# Patient Record
Sex: Female | Born: 1935 | Race: Black or African American | Hispanic: No | Smoking: Never smoker
Health system: Southern US, Community
[De-identification: ages and names within clinical notes are randomized; demographics above are authoritative.]

## PROBLEM LIST (undated history)

## (undated) DIAGNOSIS — F32A Depression, unspecified: Secondary | ICD-10-CM

## (undated) DIAGNOSIS — I219 Acute myocardial infarction, unspecified: Secondary | ICD-10-CM

## (undated) DIAGNOSIS — I10 Essential (primary) hypertension: Secondary | ICD-10-CM

## (undated) DIAGNOSIS — N183 Chronic kidney disease, stage 3 unspecified: Secondary | ICD-10-CM

## (undated) DIAGNOSIS — M1990 Unspecified osteoarthritis, unspecified site: Secondary | ICD-10-CM

## (undated) DIAGNOSIS — F329 Major depressive disorder, single episode, unspecified: Secondary | ICD-10-CM

## (undated) DIAGNOSIS — J189 Pneumonia, unspecified organism: Secondary | ICD-10-CM

## (undated) DIAGNOSIS — I639 Cerebral infarction, unspecified: Secondary | ICD-10-CM

## (undated) DIAGNOSIS — J45909 Unspecified asthma, uncomplicated: Secondary | ICD-10-CM

## (undated) DIAGNOSIS — E119 Type 2 diabetes mellitus without complications: Secondary | ICD-10-CM

## (undated) DIAGNOSIS — I509 Heart failure, unspecified: Secondary | ICD-10-CM

## (undated) HISTORY — DX: Depression, unspecified: F32.A

## (undated) HISTORY — PX: CATARACT EXTRACTION: SUR2

## (undated) HISTORY — DX: Major depressive disorder, single episode, unspecified: F32.9

## (undated) HISTORY — PX: ABDOMINAL HYSTERECTOMY: SHX81

## (undated) HISTORY — DX: Unspecified asthma, uncomplicated: J45.909

## (undated) HISTORY — DX: Essential (primary) hypertension: I10

## (undated) HISTORY — DX: Type 2 diabetes mellitus without complications: E11.9

---

## 2003-09-23 ENCOUNTER — Encounter: Admission: RE | Admit: 2003-09-23 | Discharge: 2003-09-23 | Payer: Self-pay | Admitting: *Deleted

## 2003-10-14 ENCOUNTER — Encounter: Admission: RE | Admit: 2003-10-14 | Discharge: 2003-10-14 | Payer: Self-pay | Admitting: Nephrology

## 2003-11-07 ENCOUNTER — Other Ambulatory Visit: Admission: RE | Admit: 2003-11-07 | Discharge: 2003-11-07 | Payer: Self-pay | Admitting: Nephrology

## 2004-06-23 ENCOUNTER — Ambulatory Visit: Payer: Self-pay | Admitting: Family Medicine

## 2004-07-20 ENCOUNTER — Ambulatory Visit: Payer: Self-pay | Admitting: Family Medicine

## 2004-07-29 ENCOUNTER — Ambulatory Visit: Payer: Self-pay | Admitting: Family Medicine

## 2004-08-24 ENCOUNTER — Ambulatory Visit: Payer: Self-pay | Admitting: Family Medicine

## 2004-09-02 ENCOUNTER — Ambulatory Visit: Payer: Self-pay | Admitting: Family Medicine

## 2004-10-11 ENCOUNTER — Ambulatory Visit: Payer: Self-pay | Admitting: Family Medicine

## 2004-11-16 ENCOUNTER — Ambulatory Visit: Payer: Self-pay | Admitting: Family Medicine

## 2004-12-27 ENCOUNTER — Ambulatory Visit: Payer: Self-pay | Admitting: Family Medicine

## 2005-02-14 ENCOUNTER — Ambulatory Visit: Payer: Self-pay | Admitting: Family Medicine

## 2005-03-03 ENCOUNTER — Ambulatory Visit: Payer: Self-pay | Admitting: Family Medicine

## 2005-05-26 ENCOUNTER — Ambulatory Visit: Payer: Self-pay | Admitting: Family Medicine

## 2005-07-22 ENCOUNTER — Ambulatory Visit: Payer: Self-pay | Admitting: Family Medicine

## 2005-08-26 ENCOUNTER — Ambulatory Visit: Payer: Self-pay | Admitting: Family Medicine

## 2005-10-10 ENCOUNTER — Ambulatory Visit: Payer: Self-pay | Admitting: Family Medicine

## 2005-10-17 ENCOUNTER — Ambulatory Visit: Payer: Self-pay | Admitting: Family Medicine

## 2009-01-02 ENCOUNTER — Encounter: Admission: RE | Admit: 2009-01-02 | Discharge: 2009-01-02 | Payer: Self-pay | Admitting: Nephrology

## 2011-04-08 ENCOUNTER — Emergency Department (HOSPITAL_COMMUNITY)
Admission: EM | Admit: 2011-04-08 | Discharge: 2011-04-08 | Disposition: A | Discharge status: Home / Self Care | Payer: Medicare Other | Attending: Emergency Medicine | Admitting: Emergency Medicine

## 2011-04-08 ENCOUNTER — Emergency Department (HOSPITAL_COMMUNITY): Payer: Medicare Other

## 2011-04-08 DIAGNOSIS — R29898 Other symptoms and signs involving the musculoskeletal system: Secondary | ICD-10-CM | POA: Insufficient documentation

## 2011-04-08 DIAGNOSIS — E119 Type 2 diabetes mellitus without complications: Secondary | ICD-10-CM | POA: Insufficient documentation

## 2011-04-08 DIAGNOSIS — R5381 Other malaise: Secondary | ICD-10-CM | POA: Insufficient documentation

## 2011-04-08 DIAGNOSIS — Z8673 Personal history of transient ischemic attack (TIA), and cerebral infarction without residual deficits: Secondary | ICD-10-CM | POA: Insufficient documentation

## 2011-04-08 DIAGNOSIS — R209 Unspecified disturbances of skin sensation: Secondary | ICD-10-CM | POA: Insufficient documentation

## 2014-02-04 ENCOUNTER — Ambulatory Visit: Payer: Self-pay | Admitting: Podiatrist

## 2014-07-25 ENCOUNTER — Ambulatory Visit (INDEPENDENT_AMBULATORY_CARE_PROVIDER_SITE_OTHER): Payer: Medicare HMO

## 2014-07-25 VITALS — BP 138/74 | HR 70 | Resp 12

## 2014-07-25 DIAGNOSIS — E1140 Type 2 diabetes mellitus with diabetic neuropathy, unspecified: Secondary | ICD-10-CM

## 2014-07-25 DIAGNOSIS — B351 Tinea unguium: Secondary | ICD-10-CM

## 2014-07-25 DIAGNOSIS — M79676 Pain in unspecified toe(s): Secondary | ICD-10-CM

## 2014-07-25 NOTE — Progress Notes (Signed)
   Subjective:    Patient ID: Meagan Roberts, female    DOB: 07/03/1927, 80 y.o.   MRN: [id number]  HPI  TRIM MY TOENAILS AND CHECK FEET.  Review of Systems  Musculoskeletal: Positive for myalgias, joint swelling and gait problem.  Psychiatric/Behavioral:       MEMORY LOSS  All other systems reviewed and are negative.      Objective:   Physical Exam 80 year old F connecting female presents this time having not been seen for over a year has diabetic foot and nail issues her diabetes is not been well-managed indicates her sugars been over 200 currently on prednisone she asked about seeing a diabetic specialist I suggested she consider [facility] in [place] she will look into that option. At this time patient is wearing a pair slip on shoes which are likely to tight and not appropriate for her feet however doctor has not improved diabetic shoes for her. Objective findings as follows vascular status appears to be intact although diminished DP and PT plus one over 4 bilateral there is mild +1 edema noted bilateral lower ankles epicritic and proprioceptive sensations intact although diminished on Semmes Weinstein to the forefoot digits and arch bilateral. There is normal plantar response and DTRs noted dermatologic the skin color pigment normal hair growth absent nails thick criptotic incurvated darkened yellow brittle consistent with onychomycosis no secondary infections no open wounds no ulcerations are noted patient has significant hyperesthesia.       Assessment & Plan:  Assessment this time is diabetes with history peripheral neuropathy some loss of control in the diabetes patient is currently on Sterapred prednisone which is likely raising her sugars. Patient does have digital contractures deformities thick brittle dystrophic frontal mycotic nails are debrided and the presence of diabetes and complications return for future diabetic foot palliative nail care is needed did  recommend using Fungi-Nail for the nails and again given option for [facility] for diabetes specialist. Follow-up in 3 months  Alvan Dameichard Rodolfo Gaster DPM

## 2014-07-25 NOTE — Patient Instructions (Signed)
Diabetes and Foot Care Diabetes may cause you to have problems because of poor blood supply (circulation) to your feet and legs. This may cause the skin on your feet to become thinner, break easier, and heal more slowly. Your skin may become dry, and the skin may peel and crack. You may also have nerve damage in your legs and feet causing decreased feeling in them. You may not notice minor injuries to your feet that could lead to infections or more serious problems. Taking care of your feet is one of the most important things you can do for yourself.  HOME CARE INSTRUCTIONS  Wear shoes at all times, even in the house. Do not go barefoot. Bare feet are easily injured.  Check your feet daily for blisters, cuts, and redness. If you cannot see the bottom of your feet, use a mirror or ask someone for help.  Wash your feet with warm water (do not use hot water) and mild soap. Then pat your feet and the areas between your toes until they are completely dry. Do not soak your feet as this can dry your skin.  Apply a moisturizing lotion or petroleum jelly (that does not contain alcohol and is unscented) to the skin on your feet and to dry, brittle toenails. Do not apply lotion between your toes.  Trim your toenails straight across. Do not dig under them or around the cuticle. File the edges of your nails with an emery board or nail file.  Do not cut corns or calluses or try to remove them with medicine.  Wear clean socks or stockings every day. Make sure they are not too tight. Do not wear knee-high stockings since they may decrease blood flow to your legs.  Wear shoes that fit properly and have enough cushioning. To break in new shoes, wear them for just a few hours a day. This prevents you from injuring your feet. Always look in your shoes before you put them on to be sure there are no objects inside.  Do not cross your legs. This may decrease the blood flow to your feet.  If you find a minor scrape,  cut, or break in the skin on your feet, keep it and the skin around it clean and dry. These areas may be cleansed with mild soap and water. Do not cleanse the area with peroxide, alcohol, or iodine.  When you remove an adhesive bandage, be sure not to damage the skin around it.  If you have a wound, look at it several times a day to make sure it is healing.  Do not use heating pads or hot water bottles. They may burn your skin. If you have lost feeling in your feet or legs, you may not know it is happening until it is too late.  Make sure your health care provider performs a complete foot exam at least annually or more often if you have foot problems. Report any cuts, sores, or bruises to your health care provider immediately. SEEK MEDICAL CARE IF:   You have an injury that is not healing.  You have cuts or breaks in the skin.  You have an ingrown nail.  You notice redness on your legs or feet.  You feel burning or tingling in your legs or feet.  You have pain or cramps in your legs and feet.  Your legs or feet are numb.  Your feet always feel cold. SEEK IMMEDIATE MEDICAL CARE IF:   There is increasing redness,   swelling, or pain in or around a wound.  There is a red line that goes up your leg.  Pus is coming from a wound.  You develop a fever or as directed by your health care provider.  You notice a bad smell coming from an ulcer or wound. Document Released: 07/22/2000 Document Revised: 03/27/2013 Document Reviewed: 01/01/2013 [facility] Patient Information 2015 [employer] [place]. This information is not intended to replace advice given to you by your health care provider. Make sure you discuss any questions you have with your health care provider.   Recommendations or suggestions for diabetic specialist can contact [facility] in [place], number can be found in the Yellow Pages of the [facility] directory, either Dr. Evlyn KannerSouth or any of the other diabetes  specialist there would be helpful.  For fungus nails, can obtain Fungi-Nail at any pharmacy without prescription. Apply Fungi-Nail to affected toenails daily for 12 months as instructed

## 2014-10-07 ENCOUNTER — Encounter (HOSPITAL_COMMUNITY): Payer: Self-pay | Admitting: Emergency Medicine

## 2014-10-07 ENCOUNTER — Emergency Department (HOSPITAL_COMMUNITY): Payer: Medicare HMO

## 2014-10-07 ENCOUNTER — Emergency Department (HOSPITAL_COMMUNITY)
Admission: EM | Admit: 2014-10-07 | Discharge: 2014-10-07 | Disposition: A | Discharge status: Home / Self Care | Payer: Medicare HMO | Attending: Emergency Medicine | Admitting: Emergency Medicine

## 2014-10-07 DIAGNOSIS — R200 Anesthesia of skin: Secondary | ICD-10-CM | POA: Diagnosis present

## 2014-10-07 DIAGNOSIS — I10 Essential (primary) hypertension: Secondary | ICD-10-CM | POA: Diagnosis not present

## 2014-10-07 DIAGNOSIS — H1131 Conjunctival hemorrhage, right eye: Secondary | ICD-10-CM | POA: Insufficient documentation

## 2014-10-07 DIAGNOSIS — Z8659 Personal history of other mental and behavioral disorders: Secondary | ICD-10-CM | POA: Insufficient documentation

## 2014-10-07 DIAGNOSIS — Z8673 Personal history of transient ischemic attack (TIA), and cerebral infarction without residual deficits: Secondary | ICD-10-CM | POA: Diagnosis not present

## 2014-10-07 DIAGNOSIS — E119 Type 2 diabetes mellitus without complications: Secondary | ICD-10-CM | POA: Insufficient documentation

## 2014-10-07 DIAGNOSIS — Z7952 Long term (current) use of systemic steroids: Secondary | ICD-10-CM | POA: Insufficient documentation

## 2014-10-07 DIAGNOSIS — J45909 Unspecified asthma, uncomplicated: Secondary | ICD-10-CM | POA: Insufficient documentation

## 2014-10-07 DIAGNOSIS — Z79899 Other long term (current) drug therapy: Secondary | ICD-10-CM | POA: Insufficient documentation

## 2014-10-07 DIAGNOSIS — R202 Paresthesia of skin: Secondary | ICD-10-CM

## 2014-10-07 DIAGNOSIS — I252 Old myocardial infarction: Secondary | ICD-10-CM | POA: Diagnosis not present

## 2014-10-07 HISTORY — DX: Acute myocardial infarction, unspecified: I21.9

## 2014-10-07 HISTORY — DX: Cerebral infarction, unspecified: I63.9

## 2014-10-07 LAB — COMPREHENSIVE METABOLIC PANEL
ALT: 26 U/L (ref 0–35)
AST: 48 U/L — ABNORMAL HIGH (ref 0–37)
Albumin: 3.7 g/dL (ref 3.5–5.2)
Alkaline Phosphatase: 82 U/L (ref 39–117)
Anion gap: 6 (ref 5–15)
BUN: 16 mg/dL (ref 6–23)
CALCIUM: 9.2 mg/dL (ref 8.4–10.5)
CO2: 30 mmol/L (ref 19–32)
CREATININE: 1.29 mg/dL — AB (ref 0.50–1.10)
Chloride: 103 mmol/L (ref 96–112)
GFR, EST AFRICAN AMERICAN: 43 mL/min — AB (ref >90)
GFR, EST NON AFRICAN AMERICAN: 37 mL/min — AB (ref >90)
Glucose, Bld: 103 mg/dL — ABNORMAL HIGH (ref 70–99)
Potassium: 3.1 mmol/L — ABNORMAL LOW (ref 3.5–5.1)
SODIUM: 139 mmol/L (ref 135–145)
Total Bilirubin: 0.8 mg/dL (ref 0.3–1.2)
Total Protein: 7.1 g/dL (ref 6.0–8.3)

## 2014-10-07 LAB — I-STAT CHEM 8, ED
BUN: 20 mg/dL (ref 6–23)
Calcium, Ion: 1.16 mmol/L (ref 1.13–1.30)
Chloride: 99 mmol/L (ref 96–112)
Creatinine, Ser: 1.2 mg/dL — ABNORMAL HIGH (ref 0.50–1.10)
Glucose, Bld: 102 mg/dL — ABNORMAL HIGH (ref 70–99)
HCT: 47 % — ABNORMAL HIGH (ref 36.0–46.0)
HEMOGLOBIN: 16 g/dL — AB (ref 12.0–15.0)
Potassium: 3.1 mmol/L — ABNORMAL LOW (ref 3.5–5.1)
Sodium: 143 mmol/L (ref 135–145)
TCO2: 27 mmol/L (ref 0–100)

## 2014-10-07 LAB — DIFFERENTIAL
Basophils Absolute: 0 10*3/uL (ref 0.0–0.1)
Basophils Relative: 0 % (ref 0–1)
EOS PCT: 6 % — AB (ref 0–5)
Eosinophils Absolute: 0.3 10*3/uL (ref 0.0–0.7)
LYMPHS PCT: 41 % (ref 12–46)
Lymphs Abs: 2.2 10*3/uL (ref 0.7–4.0)
MONO ABS: 0.5 10*3/uL (ref 0.1–1.0)
Monocytes Relative: 9 % (ref 3–12)
Neutro Abs: 2.3 10*3/uL (ref 1.7–7.7)
Neutrophils Relative %: 44 % (ref 43–77)

## 2014-10-07 LAB — I-STAT TROPONIN, ED: Troponin i, poc: 0.01 ng/mL (ref 0.00–0.08)

## 2014-10-07 LAB — CBG MONITORING, ED
Glucose-Capillary: 46 mg/dL — ABNORMAL LOW (ref 70–99)
Glucose-Capillary: 165 mg/dL — ABNORMAL HIGH (ref 70–99)
Glucose-Capillary: 182 mg/dL — ABNORMAL HIGH (ref 70–99)

## 2014-10-07 LAB — CBC
HCT: 44 % (ref 36.0–46.0)
Hemoglobin: 14.5 g/dL (ref 12.0–15.0)
MCH: 26.4 pg (ref 26.0–34.0)
MCHC: 33 g/dL (ref 30.0–36.0)
MCV: 80 fL (ref 78.0–100.0)
PLATELETS: 201 10*3/uL (ref 150–400)
RBC: 5.5 MIL/uL — ABNORMAL HIGH (ref 3.87–5.11)
RDW: 15.9 % — ABNORMAL HIGH (ref 11.5–15.5)
WBC: 5.2 10*3/uL (ref 4.0–10.5)

## 2014-10-07 LAB — PROTIME-INR
INR: 1.05 (ref 0.00–1.49)
Prothrombin Time: 13.8 seconds (ref 11.6–15.2)

## 2014-10-07 LAB — APTT: APTT: 30 s (ref 24–37)

## 2014-10-07 MED ORDER — POTASSIUM CHLORIDE CRYS ER 20 MEQ PO TBCR
40.0000 meq | EXTENDED_RELEASE_TABLET | Freq: Once | ORAL | Status: AC
  Administered 2014-10-07: 40 meq via ORAL
  Filled 2014-10-07: qty 2

## 2014-10-07 MED ORDER — POTASSIUM CHLORIDE CRYS ER 20 MEQ PO TBCR: 40.0000 meq | EXTENDED_RELEASE_TABLET | Freq: Two times a day (BID) | ORAL | Status: DC

## 2014-10-07 MED ORDER — DEXTROSE 50 % IV SOLN
50.0000 mL | Freq: Once | INTRAVENOUS | Status: AC
  Administered 2014-10-07: 50 mL via INTRAVENOUS
  Filled 2014-10-07: qty 50

## 2014-10-07 MED ORDER — ASPIRIN 81 MG PO CHEW
324.0000 mg | CHEWABLE_TABLET | Freq: Once | ORAL | Status: AC
  Administered 2014-10-07: 324 mg via ORAL
  Filled 2014-10-07: qty 4

## 2014-10-07 MED ORDER — LORAZEPAM 2 MG/ML IJ SOLN: 1.0000 mg | Freq: Once | INTRAMUSCULAR | Status: DC

## 2014-10-07 NOTE — ED Notes (Signed)
Neurology at bedside.

## 2014-10-07 NOTE — ED Notes (Addendum)
Patient in xray at this time, Family has left stated she will be back later in day, and has taken patient purse and cane with her, but left patient clothes in room in a belonging bag.

## 2014-10-07 NOTE — ED Notes (Signed)
Neuro at bedside.

## 2014-10-07 NOTE — Consult Note (Signed)
Referring Physician: Gwendolyn GrantWalden    Chief Complaint: right arm and leg pain and decreased sensation  HPI:                                                                                                                                         Meagan Roberts is an 81 y.o. female who lives with her daughter.  She states that yesterday "at some point in the afternoon she noted right arm and leg pain and decreased sensation".  She feels the symptoms have not worsened but also not improved.  Patient is not a good historian but states she has had a previous stroke which caused significant pain but cannot tell me which side. Currently she is feeling sick, feels "like she is going to faint"  But feels better after the door was opened. She denies any other symptoms such as HA, neck pain, blurred vision, weakness.    She does note she has a long history of back pain that involves her right LE.   Of note--on room air patients O2 saturation was 85 but with 2L it is 95%.  K+ 3.1 and currently being replenished.   Date last known well: Date: 10/06/2014 Time last known well: Unable to determine tPA Given: No: out of window Modified Rankin: Rankin Score=0    Past Medical History  Diagnosis Date  . Diabetes mellitus without complication   . Hypertension   . Asthma   . Depression   . Stroke   . MI (myocardial infarction)     History reviewed. No pertinent past surgical history.  Family History  Problem Relation Age of Onset  . Hypertension Mother   . Hypertension Father    Social History:  reports that she has never smoked. She does not have any smokeless tobacco history on file. She reports that she does not drink alcohol or use illicit drugs.  Allergies: No Known Allergies  Medications:                                                                                                                           No current facility-administered medications for this encounter.   Current Outpatient  Prescriptions  Medication Sig Dispense Refill  . atorvastatin (LIPITOR) 40 MG tablet   1  . AZOR 10-40 MG per tablet     . B-D ULTRAFINE III SHORT  PEN 31G X 8 MM MISC   0  . carvedilol (COREG) 25 MG tablet   0  . clopidogrel (PLAVIX) 75 MG tablet   0  . colchicine 0.6 MG tablet   0  . furosemide (LASIX) 40 MG tablet   0  . HUMALOG KWIKPEN 100 UNIT/ML KiwkPen   0  . LANTUS SOLOSTAR 100 UNIT/ML Solostar Pen   1  . ONE TOUCH ULTRA TEST test strip   0  . prednisoLONE acetate (PRED FORTE) 1 % ophthalmic suspension   0  . predniSONE (DELTASONE) 50 MG tablet Take 50 mg by mouth daily.  0  . traMADol (ULTRAM) 50 MG tablet   0     ROS:                                                                                                                                       History obtained from the patient  General ROS: negative for - chills, fatigue, fever, night sweats, weight gain or weight loss Psychological ROS: negative for - behavioral disorder, hallucinations, memory difficulties, mood swings or suicidal ideation Ophthalmic ROS: negative for - blurry vision, double vision, eye pain or loss of vision ENT ROS: negative for - epistaxis, nasal discharge, oral lesions, sore throat, tinnitus or vertigo Allergy and Immunology ROS: negative for - hives or itchy/watery eyes Hematological and Lymphatic ROS: negative for - bleeding problems, bruising or swollen lymph nodes Endocrine ROS: negative for - galactorrhea, hair pattern changes, polydipsia/polyuria or temperature intolerance Respiratory ROS: negative for - cough, hemoptysis, shortness of breath or wheezing Cardiovascular ROS: negative for - chest pain, dyspnea on exertion, edema or irregular heartbeat Gastrointestinal ROS: negative for - abdominal pain, diarrhea, hematemesis, nausea/vomiting or stool incontinence Genito-Urinary ROS: negative for - dysuria, hematuria, incontinence or urinary frequency/urgency Musculoskeletal ROS: negative for -  joint swelling or muscular weakness Neurological ROS: as noted in HPI Dermatological ROS: negative for rash and skin lesion changes  Neurologic Examination:                                                                                                      Blood pressure 141/92, pulse 64, temperature 98.8 F (37.1 C), temperature source Oral, resp. rate 17, height  (1.676 m), weight 104.327 kg (230 lb), SpO2 90 %.  HEENT-  Normocephalic, no lesions, without obvious abnormality.  Normal external eye and conjunctiva.  Normal TM's bilaterally.  Normal auditory canals and external ears. Normal external nose, mucus  membranes and septum.  Normal pharynx. Cardiovascular- S1, S2 normal, pulses palpable throughout   Lungs- chest clear, no wheezing, rales, normal symmetric air entry Abdomen- normal findings: bowel sounds normal Extremities- no edema Lymph-no adenopathy palpable Musculoskeletal-no joint tenderness, deformity or swelling Skin-warm and dry, no hyperpigmentation, vitiligo, or suspicious lesions  Neurological Examination Mental Status: Alert, oriented to hospital, month and year.  Speech fluent without evidence of aphasia.  Able to follow 3 step commands without difficulty. Cranial Nerves: II: Discs flat left eye; Visual fields grossly normal left eye, Right eye has significant sclerosis of cornea and pupil is asymetric and non reactive (post surgical), Left pupil equal, round, reactive III,IV, VI: ptosis not present, extra-ocular motions intact bilaterally V,VII: smile symmetric, facial light touch sensation decreased on the right VIII: hearing normal bilaterally IX,X: gag reflex present XI: bilateral shoulder shrug XII: midline tongue extension Motor: Right : Upper extremity   5/5    Left:     Upper extremity   5/5  Lower extremity   5/5     Lower extremity   5/5 Tone and bulk:normal tone throughout; no atrophy noted Sensory: Pinprick and light touch intact decreased on the  right arm and leg Deep Tendon Reflexes: 1+ and symmetric throughout bilateral UE, no KJ or AJ Plantars: Mute bilaterally Cerebellar: normal finger-to-nose and normal heel-to-shin test Gait: not tested secondary to safety       Lab Results: Basic Metabolic Panel:  Recent Labs Lab 10/07/14 1121 10/07/14 1133  NA 139 143  K 3.1* 3.1*  CL 103 99  CO2 30  --   GLUCOSE 103* 102*  BUN 16 20  CREATININE 1.29* 1.20*  CALCIUM 9.2  --     Liver Function Tests:  Recent Labs Lab 10/07/14 1121  AST 48*  ALT 26  ALKPHOS 82  BILITOT 0.8  PROT 7.1  ALBUMIN 3.7   No results for input(s): LIPASE, AMYLASE in the last 168 hours. No results for input(s): AMMONIA in the last 168 hours.  CBC:  Recent Labs Lab 10/07/14 1121 10/07/14 1133  WBC 5.2  --   NEUTROABS 2.3  --   HGB 14.5 16.0*  HCT 44.0 47.0*  MCV 80.0  --   PLT 201  --     Cardiac Enzymes: No results for input(s): CKTOTAL, CKMB, CKMBINDEX, TROPONINI in the last 168 hours.  Lipid Panel: No results for input(s): CHOL, TRIG, HDL, CHOLHDL, VLDL, LDLCALC in the last 168 hours.  CBG: No results for input(s): GLUCAP in the last 168 hours.  Microbiology: No results found for this or any previous visit.  Coagulation Studies:  Recent Labs  10/07/14 1121  LABPROT 13.8  INR 1.05    Imaging: Dg Chest 2 View  10/07/2014   CLINICAL DATA:  RIGHT-side numbness, hypertension, diabetes, asthma, prior MI  EXAM: CHEST  2 VIEW  COMPARISON:  11/04/2013  FINDINGS: Enlargement of cardiac silhouette with pulmonary vascular congestion.  Tortuous aorta.  Bronchitic changes with bibasilar atelectasis.  No definite infiltrate, pleural effusion or pneumothorax.  Prior pin fixation of the LEFT clavicle.  Mild degenerative disc disease changes thoracic spine.  IMPRESSION: Bronchitic changes with bibasilar atelectasis greater on LEFT.  Enlargement of cardiac silhouette with pulmonary vascular congestion.   Electronically Signed   By:  Ulyses Southward M.D.   On: 10/07/2014 12:05   Ct Head (brain) Wo Contrast  10/07/2014   CLINICAL DATA:  Right-sided numbness.  Stroke symptoms.  EXAM: CT HEAD WITHOUT CONTRAST  TECHNIQUE: Contiguous  axial images were obtained from the base of the skull through the vertex without intravenous contrast.  COMPARISON:  04/08/2011  FINDINGS: Sinuses/Soft tissues: Remote right medial orbital wall fracture. Clear paranasal sinuses and mastoid air cells.  Intracranial: moderate low density in the periventricular white matter likely related to small vessel disease. Ventriculomegaly, felt to be related to cerebral atrophy. No mass lesion, hemorrhage, hydrocephalus, acute infarct, intra-axial, or extra-axial fluid collection.  IMPRESSION: 1.  No acute intracranial abnormality. 2.  Cerebral atrophy and small vessel ischemic change.   Electronically Signed   By: Jeronimo Greaves M.D.   On: 10/07/2014 12:44       Assessment and plan discussed with with attending physician and they are in agreement.    Felicie Morn PA-C [facility] [phone]  10/07/2014, 1:53 PM  Stroke Risk Factors - diabetes mellitus and hypertension   Assessment: 81 y.o. female with new onset right face, arm and leg pain and decreased sensation.  Exam is non focal other than sensory findings. Her symptoms are similar to a previous stroke, so possibilities include eunmasking of previous symptoms due to physiological stressor vs recurrent stroke.   1) MRI brain, if positive would pursue further stroke workup.  2) If negative, no further workup at this time.   Ritta Slot, MD [facility] [phone]  If 7pm- 7am, please page neurology on call as listed in AMION.

## 2014-10-07 NOTE — ED Notes (Signed)
Onset last night developed numbness right upper and lower extremity at 1800. Took a baby aspirin and went to sleep. Woke up this morning and still had numbness spoke with Doctor instructed to go to ED for evaluation.

## 2014-10-07 NOTE — ED Provider Notes (Signed)
CSN: [id number]     Arrival date & time 10/07/14  1107 History   First MD Initiated Contact with Patient 10/07/14 1114     Chief Complaint  Patient presents with  . Stroke Symptoms     (Consider location/radiation/quality/duration/timing/severity/associated sxs/prior Treatment) HPI Comments: Had some R arm tingling last night, not similar to prior stroke. Took an aspirin with some relief, which alleviated numbness a little, however numbness persisted.  Patient is a 81 y.o. female presenting with neurologic complaint. The history is provided by the patient.  Neurologic Problem This is a new problem. The current episode started yesterday. The problem occurs constantly. The problem has not changed (waxing/waning) since onset.Pertinent negatives include no chest pain and no abdominal pain. Nothing aggravates the symptoms. Nothing relieves the symptoms. She has tried ASA for the symptoms. The treatment provided moderate relief.    Past Medical History  Diagnosis Date  . Diabetes mellitus without complication   . Hypertension   . Asthma   . Depression   . Stroke   . MI (myocardial infarction)    History reviewed. No pertinent past surgical history. No family history on file. History  Substance Use Topics  . Smoking status: Never Smoker   . Smokeless tobacco: Not on file  . Alcohol Use: No   OB History    No data available     Review of Systems  Constitutional: Negative for fever and chills.  Cardiovascular: Negative for chest pain and leg swelling.  Gastrointestinal: Negative for vomiting and abdominal pain.  All other systems reviewed and are negative.     Allergies  Review of patient's allergies indicates no known allergies.  Home Medications   Prior to Admission medications   Medication Sig Start Date End Date Taking? Authorizing Provider  atorvastatin (LIPITOR) 40 MG tablet  06/30/14   Historical Provider, MD  AZOR 10-40 MG per tablet  06/13/14   Historical  Provider, MD  B-D ULTRAFINE III SHORT PEN 31G X 8 MM MISC  06/13/14   Historical Provider, MD  carvedilol (COREG) 25 MG tablet  06/03/14   Historical Provider, MD  clopidogrel (PLAVIX) 75 MG tablet  06/13/14   Historical Provider, MD  colchicine 0.6 MG tablet  07/03/14   Historical Provider, MD  furosemide (LASIX) 40 MG tablet  06/03/14   Historical Provider, MD  HUMALOG KWIKPEN 100 UNIT/ML KiwkPen  06/09/14   Historical Provider, MD  LANTUS SOLOSTAR 100 UNIT/ML Solostar Pen  06/05/14   Historical Provider, MD  ONE TOUCH ULTRA TEST test strip  06/09/14   Historical Provider, MD  prednisoLONE acetate (PRED FORTE) 1 % ophthalmic suspension  07/14/14   Historical Provider, MD  predniSONE (DELTASONE) 50 MG tablet Take 50 mg by mouth daily. 07/15/14   Historical Provider, MD  traMADol (ULTRAM) 50 MG tablet  06/27/14   Historical Provider, MD   BP 135/87 mmHg  Pulse 76  Resp 76  Ht 5\' 6"  (1.676 m)  Wt 230 lb (104.327 kg)  BMI 37.14 kg/m2  SpO2 92% Physical Exam  Constitutional: She is oriented to person, place, and time. She appears well-developed and well-nourished. No distress.  HENT:  Head: Normocephalic and atraumatic.  Mouth/Throat: Oropharynx is clear and moist.  Eyes: EOM are normal. Pupils are equal, round, and reactive to light.  Large R lower eye subconjunctival hemorrhage  Neck: Normal range of motion. Neck supple.  Cardiovascular: Normal rate and regular rhythm.  Exam reveals no friction rub.   No murmur heard. Pulmonary/Chest: Effort  normal and breath sounds normal. No respiratory distress. She has no wheezes. She has no rales.  Abdominal: Soft. She exhibits no distension. There is no tenderness. There is no rebound.  Musculoskeletal: Normal range of motion. She exhibits no edema.  Neurological: She is alert and oriented to person, place, and time. No cranial nerve deficit. She exhibits normal muscle tone. Coordination normal.  Skin: No rash noted. She is not diaphoretic.  Nursing  note and vitals reviewed.   ED Course  Procedures (including critical care time) Labs Review Labs Reviewed  CBC - Abnormal; Notable for the following:    RBC 5.50 (*)    RDW 15.9 (*)    All other components within normal limits  DIFFERENTIAL - Abnormal; Notable for the following:    Eosinophils Relative 6 (*)    All other components within normal limits  I-STAT CHEM 8, ED - Abnormal; Notable for the following:    Potassium 3.1 (*)    Creatinine, Ser 1.20 (*)    Glucose, Bld 102 (*)    Hemoglobin 16.0 (*)    HCT 47.0 (*)    All other components within normal limits  PROTIME-INR  APTT  COMPREHENSIVE METABOLIC PANEL  I-STAT TROPOININ, ED    Imaging Review No results found.   EKG Interpretation   Date/Time:  Tuesday October 07 2014 11:14:10 EST Ventricular Rate:  74 PR Interval:  200 QRS Duration: 116 QT Interval:  424 QTC Calculation: 470 R Axis:   -51 Text Interpretation:  Normal sinus rhythm Left axis deviation Possible  Anterolateral infarct , age undetermined Abnormal ECG No prior for  comparison Confirmed by [facility]  MD, Marrion Finan ([id number]) on 10/07/2014 11:26:25 AM      MDM   Final diagnoses:  Numbness on right side    65F presents with R side numbness, began last night. Improved after aspirin, but never fully resolved. There again this morning, constant. Hx of prior CVAs, however no symptoms like this. No CP, SOB. AFVSS here. Mild R side altered light touch sensation of arm, leg, face. No cranial nerves abnormalities. Strength normal.  Concern for CVA, will CT and plan on Neuro consult, MR. MR negative. Oxygen saturation ok on room air, hovering around 90. Isn't complaining of SOB. Plan for discharge and f/u with PCP. She is comfortable with the plan to go home.  Elwin Mocha, MD 10/07/14 1745

## 2014-10-07 NOTE — Discharge Instructions (Signed)

## 2014-10-07 NOTE — ED Notes (Signed)
Pt is in stable condition upon d/c and is escorted from ED via wheelchair. 

## 2014-10-24 ENCOUNTER — Ambulatory Visit: Payer: Medicare HMO

## 2017-02-02 DIAGNOSIS — I2510 Atherosclerotic heart disease of native coronary artery without angina pectoris: Secondary | ICD-10-CM

## 2017-02-02 DIAGNOSIS — N179 Acute kidney failure, unspecified: Secondary | ICD-10-CM

## 2017-02-02 DIAGNOSIS — R509 Fever, unspecified: Secondary | ICD-10-CM

## 2017-02-02 DIAGNOSIS — F0390 Unspecified dementia without behavioral disturbance: Secondary | ICD-10-CM

## 2017-02-02 DIAGNOSIS — R748 Abnormal levels of other serum enzymes: Secondary | ICD-10-CM

## 2017-02-02 DIAGNOSIS — J9621 Acute and chronic respiratory failure with hypoxia: Secondary | ICD-10-CM

## 2017-02-05 DIAGNOSIS — J189 Pneumonia, unspecified organism: Secondary | ICD-10-CM

## 2017-02-05 HISTORY — DX: Pneumonia, unspecified organism: J18.9

## 2017-02-08 DIAGNOSIS — E160 Drug-induced hypoglycemia without coma: Secondary | ICD-10-CM

## 2017-02-08 DIAGNOSIS — E119 Type 2 diabetes mellitus without complications: Secondary | ICD-10-CM

## 2017-02-08 DIAGNOSIS — I214 Non-ST elevation (NSTEMI) myocardial infarction: Secondary | ICD-10-CM

## 2017-02-10 ENCOUNTER — Encounter (HOSPITAL_COMMUNITY): Payer: Self-pay | Admitting: General Practice

## 2017-02-10 ENCOUNTER — Inpatient Hospital Stay (HOSPITAL_COMMUNITY): Payer: Medicare HMO

## 2017-02-10 ENCOUNTER — Inpatient Hospital Stay (HOSPITAL_COMMUNITY)
Admission: AD | Admit: 2017-02-10 | Discharge: 2017-02-20 | DRG: 280 | Disposition: A | Discharge status: Skilled Nursing Facility | Payer: Medicare HMO | Source: Other Acute Inpatient Hospital | Attending: Cardiology | Admitting: Cardiology

## 2017-02-10 DIAGNOSIS — R748 Abnormal levels of other serum enzymes: Secondary | ICD-10-CM | POA: Diagnosis not present

## 2017-02-10 DIAGNOSIS — J9601 Acute respiratory failure with hypoxia: Secondary | ICD-10-CM

## 2017-02-10 DIAGNOSIS — J189 Pneumonia, unspecified organism: Secondary | ICD-10-CM | POA: Diagnosis present

## 2017-02-10 DIAGNOSIS — T424X5A Adverse effect of benzodiazepines, initial encounter: Secondary | ICD-10-CM | POA: Diagnosis not present

## 2017-02-10 DIAGNOSIS — R0902 Hypoxemia: Secondary | ICD-10-CM

## 2017-02-10 DIAGNOSIS — R739 Hyperglycemia, unspecified: Secondary | ICD-10-CM | POA: Diagnosis not present

## 2017-02-10 DIAGNOSIS — J9621 Acute and chronic respiratory failure with hypoxia: Secondary | ICD-10-CM | POA: Diagnosis present

## 2017-02-10 DIAGNOSIS — J45909 Unspecified asthma, uncomplicated: Secondary | ICD-10-CM | POA: Diagnosis not present

## 2017-02-10 DIAGNOSIS — J9690 Respiratory failure, unspecified, unspecified whether with hypoxia or hypercapnia: Secondary | ICD-10-CM

## 2017-02-10 DIAGNOSIS — J810 Acute pulmonary edema: Secondary | ICD-10-CM | POA: Diagnosis not present

## 2017-02-10 DIAGNOSIS — I5033 Acute on chronic diastolic (congestive) heart failure: Secondary | ICD-10-CM | POA: Diagnosis not present

## 2017-02-10 DIAGNOSIS — Y95 Nosocomial condition: Secondary | ICD-10-CM | POA: Diagnosis present

## 2017-02-10 DIAGNOSIS — E1165 Type 2 diabetes mellitus with hyperglycemia: Secondary | ICD-10-CM | POA: Diagnosis not present

## 2017-02-10 DIAGNOSIS — Z8701 Personal history of pneumonia (recurrent): Secondary | ICD-10-CM

## 2017-02-10 DIAGNOSIS — G92 Toxic encephalopathy: Secondary | ICD-10-CM | POA: Diagnosis present

## 2017-02-10 DIAGNOSIS — I2510 Atherosclerotic heart disease of native coronary artery without angina pectoris: Secondary | ICD-10-CM | POA: Diagnosis not present

## 2017-02-10 DIAGNOSIS — Z880 Allergy status to penicillin: Secondary | ICD-10-CM

## 2017-02-10 DIAGNOSIS — J9602 Acute respiratory failure with hypercapnia: Secondary | ICD-10-CM | POA: Diagnosis not present

## 2017-02-10 DIAGNOSIS — I5031 Acute diastolic (congestive) heart failure: Secondary | ICD-10-CM | POA: Diagnosis not present

## 2017-02-10 DIAGNOSIS — Z79899 Other long term (current) drug therapy: Secondary | ICD-10-CM

## 2017-02-10 DIAGNOSIS — I700 Atherosclerosis of aorta: Secondary | ICD-10-CM | POA: Diagnosis present

## 2017-02-10 DIAGNOSIS — E785 Hyperlipidemia, unspecified: Secondary | ICD-10-CM | POA: Diagnosis present

## 2017-02-10 DIAGNOSIS — L89152 Pressure ulcer of sacral region, stage 2: Secondary | ICD-10-CM | POA: Diagnosis not present

## 2017-02-10 DIAGNOSIS — R0989 Other specified symptoms and signs involving the circulatory and respiratory systems: Secondary | ICD-10-CM | POA: Diagnosis present

## 2017-02-10 DIAGNOSIS — E662 Morbid (severe) obesity with alveolar hypoventilation: Secondary | ICD-10-CM | POA: Diagnosis present

## 2017-02-10 DIAGNOSIS — I5041 Acute combined systolic (congestive) and diastolic (congestive) heart failure: Secondary | ICD-10-CM | POA: Diagnosis not present

## 2017-02-10 DIAGNOSIS — I5042 Chronic combined systolic (congestive) and diastolic (congestive) heart failure: Secondary | ICD-10-CM

## 2017-02-10 DIAGNOSIS — Z882 Allergy status to sulfonamides status: Secondary | ICD-10-CM

## 2017-02-10 DIAGNOSIS — E118 Type 2 diabetes mellitus with unspecified complications: Secondary | ICD-10-CM | POA: Diagnosis not present

## 2017-02-10 DIAGNOSIS — E1122 Type 2 diabetes mellitus with diabetic chronic kidney disease: Secondary | ICD-10-CM | POA: Diagnosis present

## 2017-02-10 DIAGNOSIS — L89302 Pressure ulcer of unspecified buttock, stage 2: Secondary | ICD-10-CM | POA: Diagnosis not present

## 2017-02-10 DIAGNOSIS — L89153 Pressure ulcer of sacral region, stage 3: Secondary | ICD-10-CM

## 2017-02-10 DIAGNOSIS — Z90710 Acquired absence of both cervix and uterus: Secondary | ICD-10-CM

## 2017-02-10 DIAGNOSIS — N183 Chronic kidney disease, stage 3 (moderate): Secondary | ICD-10-CM | POA: Diagnosis present

## 2017-02-10 DIAGNOSIS — I214 Non-ST elevation (NSTEMI) myocardial infarction: Secondary | ICD-10-CM | POA: Diagnosis not present

## 2017-02-10 DIAGNOSIS — N179 Acute kidney failure, unspecified: Secondary | ICD-10-CM | POA: Diagnosis present

## 2017-02-10 DIAGNOSIS — Z794 Long term (current) use of insulin: Secondary | ICD-10-CM

## 2017-02-10 DIAGNOSIS — Z6839 Body mass index (BMI) 39.0-39.9, adult: Secondary | ICD-10-CM | POA: Diagnosis not present

## 2017-02-10 DIAGNOSIS — I252 Old myocardial infarction: Secondary | ICD-10-CM | POA: Diagnosis not present

## 2017-02-10 DIAGNOSIS — J9611 Chronic respiratory failure with hypoxia: Secondary | ICD-10-CM

## 2017-02-10 DIAGNOSIS — I25110 Atherosclerotic heart disease of native coronary artery with unstable angina pectoris: Secondary | ICD-10-CM | POA: Diagnosis present

## 2017-02-10 DIAGNOSIS — L8990 Pressure ulcer of unspecified site, unspecified stage: Secondary | ICD-10-CM | POA: Insufficient documentation

## 2017-02-10 DIAGNOSIS — J9600 Acute respiratory failure, unspecified whether with hypoxia or hypercapnia: Secondary | ICD-10-CM | POA: Diagnosis not present

## 2017-02-10 DIAGNOSIS — R0603 Acute respiratory distress: Secondary | ICD-10-CM | POA: Diagnosis not present

## 2017-02-10 DIAGNOSIS — I5043 Acute on chronic combined systolic (congestive) and diastolic (congestive) heart failure: Secondary | ICD-10-CM | POA: Diagnosis present

## 2017-02-10 DIAGNOSIS — I130 Hypertensive heart and chronic kidney disease with heart failure and stage 1 through stage 4 chronic kidney disease, or unspecified chronic kidney disease: Secondary | ICD-10-CM | POA: Diagnosis present

## 2017-02-10 DIAGNOSIS — R0600 Dyspnea, unspecified: Secondary | ICD-10-CM

## 2017-02-10 DIAGNOSIS — Z888 Allergy status to other drugs, medicaments and biological substances status: Secondary | ICD-10-CM

## 2017-02-10 DIAGNOSIS — Z8673 Personal history of transient ischemic attack (TIA), and cerebral infarction without residual deficits: Secondary | ICD-10-CM | POA: Diagnosis not present

## 2017-02-10 DIAGNOSIS — Z9981 Dependence on supplemental oxygen: Secondary | ICD-10-CM

## 2017-02-10 DIAGNOSIS — Z8249 Family history of ischemic heart disease and other diseases of the circulatory system: Secondary | ICD-10-CM | POA: Diagnosis not present

## 2017-02-10 DIAGNOSIS — IMO0001 Reserved for inherently not codable concepts without codable children: Secondary | ICD-10-CM

## 2017-02-10 DIAGNOSIS — I447 Left bundle-branch block, unspecified: Secondary | ICD-10-CM | POA: Diagnosis not present

## 2017-02-10 DIAGNOSIS — I10 Essential (primary) hypertension: Secondary | ICD-10-CM | POA: Diagnosis not present

## 2017-02-10 HISTORY — DX: Chronic kidney disease, stage 3 (moderate): N18.3

## 2017-02-10 HISTORY — DX: Heart failure, unspecified: I50.9

## 2017-02-10 HISTORY — DX: Unspecified osteoarthritis, unspecified site: M19.90

## 2017-02-10 HISTORY — DX: Pneumonia, unspecified organism: J18.9

## 2017-02-10 HISTORY — DX: Chronic kidney disease, stage 3 unspecified: N18.30

## 2017-02-10 LAB — BLOOD GAS, ARTERIAL
ACID-BASE EXCESS: 4.3 mmol/L — AB (ref 0.0–2.0)
Bicarbonate: 29.6 mmol/L — ABNORMAL HIGH (ref 20.0–28.0)
Delivery systems: POSITIVE
Drawn by: 331761
EXPIRATORY PAP: 7
FIO2: 60
INSPIRATORY PAP: 12
LHR: 12 {breaths}/min
O2 SAT: 86.8 %
PATIENT TEMPERATURE: 98.6
PCO2 ART: 55.9 mmHg — AB (ref 32.0–48.0)
PH ART: 7.344 — AB (ref 7.350–7.450)
PO2 ART: 56.6 mmHg — AB (ref 83.0–108.0)

## 2017-02-10 LAB — HEPARIN LEVEL (UNFRACTIONATED): Heparin Unfractionated: 0.47 IU/mL (ref 0.30–0.70)

## 2017-02-10 LAB — GLUCOSE, CAPILLARY: GLUCOSE-CAPILLARY: 265 mg/dL — AB (ref 65–99)

## 2017-02-10 LAB — BASIC METABOLIC PANEL
Anion gap: 7 (ref 5–15)
BUN: 21 mg/dL — ABNORMAL HIGH (ref 6–20)
CHLORIDE: 102 mmol/L (ref 101–111)
CO2: 28 mmol/L (ref 22–32)
CREATININE: 1.47 mg/dL — AB (ref 0.44–1.00)
Calcium: 9 mg/dL (ref 8.9–10.3)
GFR calc Af Amer: 36 mL/min — ABNORMAL LOW (ref >60)
GFR calc non Af Amer: 31 mL/min — ABNORMAL LOW (ref >60)
Glucose, Bld: 291 mg/dL — ABNORMAL HIGH (ref 65–99)
POTASSIUM: 4.3 mmol/L (ref 3.5–5.1)
Sodium: 137 mmol/L (ref 135–145)

## 2017-02-10 LAB — BRAIN NATRIURETIC PEPTIDE: B Natriuretic Peptide: 331.9 pg/mL — ABNORMAL HIGH (ref 0.0–100.0)

## 2017-02-10 LAB — LACTIC ACID, PLASMA: LACTIC ACID, VENOUS: 1.6 mmol/L (ref 0.5–1.9)

## 2017-02-10 LAB — MRSA PCR SCREENING: MRSA BY PCR: NEGATIVE

## 2017-02-10 LAB — TROPONIN I: Troponin I: 4.66 ng/mL (ref <0.03)

## 2017-02-10 MED ORDER — IRBESARTAN 150 MG PO TABS
300.0000 mg | ORAL_TABLET | Freq: Every day | ORAL | Status: DC
  Administered 2017-02-11 – 2017-02-18 (×8): 300 mg via ORAL
  Filled 2017-02-10 (×3): qty 1
  Filled 2017-02-10: qty 2
  Filled 2017-02-10 (×4): qty 1

## 2017-02-10 MED ORDER — FLUMAZENIL 0.5 MG/5ML IV SOLN
0.2000 mg | Freq: Once | INTRAVENOUS | Status: AC
  Administered 2017-02-10: 0.2 mg via INTRAVENOUS
  Filled 2017-02-10: qty 5

## 2017-02-10 MED ORDER — COLCHICINE 0.6 MG PO TABS
0.6000 mg | ORAL_TABLET | Freq: Every day | ORAL | Status: DC
  Administered 2017-02-11 – 2017-02-20 (×10): 0.6 mg via ORAL
  Filled 2017-02-10 (×10): qty 1

## 2017-02-10 MED ORDER — NITROGLYCERIN 0.4 MG SL SUBL
0.4000 mg | SUBLINGUAL_TABLET | SUBLINGUAL | Status: DC | PRN
  Filled 2017-02-10: qty 1

## 2017-02-10 MED ORDER — FUROSEMIDE 10 MG/ML IJ SOLN
40.0000 mg | Freq: Once | INTRAMUSCULAR | Status: AC
  Administered 2017-02-10: 40 mg via INTRAVENOUS
  Filled 2017-02-10: qty 4

## 2017-02-10 MED ORDER — PANTOPRAZOLE SODIUM 40 MG PO TBEC
40.0000 mg | DELAYED_RELEASE_TABLET | Freq: Two times a day (BID) | ORAL | Status: DC
  Administered 2017-02-10 – 2017-02-20 (×20): 40 mg via ORAL
  Filled 2017-02-10 (×20): qty 1

## 2017-02-10 MED ORDER — HEPARIN (PORCINE) IN NACL 100-0.45 UNIT/ML-% IJ SOLN
1250.0000 [IU]/h | INTRAMUSCULAR | Status: DC
  Administered 2017-02-11: 1050 [IU]/h via INTRAVENOUS
  Administered 2017-02-12 – 2017-02-15 (×5): 1250 [IU]/h via INTRAVENOUS
  Filled 2017-02-10 (×6): qty 250

## 2017-02-10 MED ORDER — DEXTROSE 5 % IV SOLN
2.0000 g | INTRAVENOUS | Status: DC
  Administered 2017-02-11 – 2017-02-12 (×2): 2 g via INTRAVENOUS
  Filled 2017-02-10 (×2): qty 2

## 2017-02-10 MED ORDER — ORAL CARE MOUTH RINSE
15.0000 mL | Freq: Two times a day (BID) | OROMUCOSAL | Status: DC
  Administered 2017-02-11 – 2017-02-20 (×15): 15 mL via OROMUCOSAL

## 2017-02-10 MED ORDER — AMLODIPINE BESYLATE 10 MG PO TABS
10.0000 mg | ORAL_TABLET | Freq: Every day | ORAL | Status: DC
  Administered 2017-02-11 – 2017-02-20 (×10): 10 mg via ORAL
  Filled 2017-02-10 (×10): qty 1

## 2017-02-10 MED ORDER — AMLODIPINE-OLMESARTAN 10-40 MG PO TABS: 1.0000 | ORAL_TABLET | ORAL | Status: DC

## 2017-02-10 MED ORDER — ASPIRIN EC 81 MG PO TBEC
81.0000 mg | DELAYED_RELEASE_TABLET | Freq: Every day | ORAL | Status: DC
  Administered 2017-02-11 – 2017-02-20 (×8): 81 mg via ORAL
  Filled 2017-02-10 (×8): qty 1

## 2017-02-10 MED ORDER — INSULIN ASPART 100 UNIT/ML ~~LOC~~ SOLN
0.0000 [IU] | Freq: Three times a day (TID) | SUBCUTANEOUS | Status: DC
  Administered 2017-02-11: 5 [IU] via SUBCUTANEOUS
  Administered 2017-02-11: 11 [IU] via SUBCUTANEOUS
  Administered 2017-02-11: 5 [IU] via SUBCUTANEOUS

## 2017-02-10 MED ORDER — CHLORHEXIDINE GLUCONATE 0.12 % MT SOLN
15.0000 mL | Freq: Two times a day (BID) | OROMUCOSAL | Status: DC
  Administered 2017-02-10 – 2017-02-20 (×19): 15 mL via OROMUCOSAL
  Filled 2017-02-10 (×13): qty 15

## 2017-02-10 MED ORDER — GABAPENTIN 300 MG PO CAPS
300.0000 mg | ORAL_CAPSULE | Freq: Every day | ORAL | Status: DC
  Administered 2017-02-10 – 2017-02-19 (×10): 300 mg via ORAL
  Filled 2017-02-10 (×10): qty 1

## 2017-02-10 MED ORDER — CARVEDILOL 25 MG PO TABS
25.0000 mg | ORAL_TABLET | Freq: Every day | ORAL | Status: DC
  Administered 2017-02-11 – 2017-02-15 (×5): 25 mg via ORAL
  Filled 2017-02-10 (×5): qty 1

## 2017-02-10 MED ORDER — ACETAMINOPHEN 325 MG PO TABS
650.0000 mg | ORAL_TABLET | ORAL | Status: DC | PRN
  Administered 2017-02-11 – 2017-02-16 (×4): 650 mg via ORAL
  Filled 2017-02-10 (×4): qty 2

## 2017-02-10 MED ORDER — HYDRALAZINE HCL 20 MG/ML IJ SOLN
5.0000 mg | Freq: Once | INTRAMUSCULAR | Status: AC
  Administered 2017-02-10: 5 mg via INTRAVENOUS
  Filled 2017-02-10: qty 1

## 2017-02-10 MED ORDER — VANCOMYCIN HCL 10 G IV SOLR
2000.0000 mg | Freq: Once | INTRAVENOUS | Status: AC
  Administered 2017-02-10: 2000 mg via INTRAVENOUS
  Filled 2017-02-10: qty 2000

## 2017-02-10 MED ORDER — VANCOMYCIN HCL 10 G IV SOLR: 1250.0000 mg | INTRAVENOUS | Status: DC

## 2017-02-10 MED ORDER — PREDNISOLONE ACETATE 1 % OP SUSP
1.0000 [drp] | Freq: Two times a day (BID) | OPHTHALMIC | Status: DC
  Administered 2017-02-10 – 2017-02-20 (×20): 1 [drp] via OPHTHALMIC
  Filled 2017-02-10 (×2): qty 1

## 2017-02-10 MED ORDER — ONDANSETRON HCL 4 MG/2ML IJ SOLN: 4.0000 mg | Freq: Four times a day (QID) | INTRAMUSCULAR | Status: DC | PRN

## 2017-02-10 MED ORDER — INSULIN ASPART 100 UNIT/ML ~~LOC~~ SOLN
0.0000 [IU] | Freq: Every day | SUBCUTANEOUS | Status: DC
  Administered 2017-02-10: 3 [IU] via SUBCUTANEOUS

## 2017-02-10 MED ORDER — CLOPIDOGREL BISULFATE 75 MG PO TABS
75.0000 mg | ORAL_TABLET | Freq: Every day | ORAL | Status: DC
  Administered 2017-02-11 – 2017-02-20 (×10): 75 mg via ORAL
  Filled 2017-02-10 (×10): qty 1

## 2017-02-10 NOTE — Progress Notes (Signed)
CRITICAL VALUE ALERT  Critical Value:  Trop 4.66  Date & Time Notied:  02/10/17 2010  Provider Notified: 02/10/17 2014  Orders Received/Actions taken: No new orders given at this time.  Pt being treated for ACS protocol, on heparin, CP free currently, on Bipap.   RN will continue to monitor.

## 2017-02-10 NOTE — Progress Notes (Signed)
Removed patient from BIPAP and placed her on 10lpm high flow cannula. Respiratory will continue to monitor patients respiratory status.

## 2017-02-10 NOTE — Progress Notes (Signed)
Pt placed on HFNC 10L, but quickly had increased respiratory distress and tachycardia -- her O2 sats were in the 90s however.  She states feeling "she had too much air."  With this distress, she also c/o chest pain located under her breast.  RN attempted to reposition patient so she could breathe better, but this was to no avail.  Pt back on Bipap currently on 40%. HR 95, O2 sat 100, RR 23.

## 2017-02-10 NOTE — Progress Notes (Signed)
ANTICOAGULATION CONSULT NOTE - Follow Up Consult  Pharmacy Consult for heparin  Indication: chest pain/ACS  Allergies  Allergen Reactions  . Atorvastatin Other (See Comments)    Other reaction(s): Myalgias (intolerance)  . Penicillins Itching and Rash    Family members do not know the specifics  . Sulfur Itching and Rash    ITCHING & RASH    Patient Measurements:   Heparin Dosing Weight: 74 kg  Vital Signs: Temp: 97.9 F (36.6 C) (07/06 1527) Temp Source: Axillary (07/06 1527) BP: 126/70 (07/06 1915) Pulse Rate: 77 (07/06 1915)  Labs:  Recent Labs  02/10/17 1814  HEPARINUNFRC 0.47  CREATININE 1.47*    CrCl cannot be calculated (Unknown ideal weight.).    Assessment: 89YOF with history of CAD presented with NSTEMI. Transferred from [facility]. Not on anticoagulation pta. Per cardiology treat medically not a candidate for invasive procedures currently Current heparin infusion at 1050 units/hr. Heparin level 0.47 (wnl) CBC pending No bleeding documented  Goal of Therapy:  Heparin level 0.3-0.7 units/ml Monitor platelets by anticoagulation protocol: Yes   Plan:  Continue heparin at 1050 units/hr. Confirmatory heparin levels in am and then daily CBC daily Monitor for signs and symptoms of bleeding   Evette Cristalaylor N Kempton Milne  PharmD Candidate 02/10/2017,7:37 PM

## 2017-02-10 NOTE — Consult Note (Signed)
PULMONARY / CRITICAL CARE MEDICINE   Name: [facility] MRN: [id number] DOB: 04/05/1927    ADMISSION DATE:  02/10/2017 CONSULTATION DATE:  02/10/2017  REFERRING MD:  Croitoru  CHIEF COMPLAINT:  Dyspnea, confusion  HISTORY OF PRESENT ILLNESS:   83 y/o female with a past medical history for CKD, CHF, admitted [facility] with an NSTEMI, acute on chronic kidney failure and acute respiratory failure with hypoxemia. The patient's family provides history due to confusion.  They state that she was treated for a respiratory infection a few months back with antibiotics and recovered to her baseline of walking around the house and doing ADL's with independence (though she lives with her sister).  However for one month prior to admission she developed more lethargy and was lying around more than normal.  She was [facility] with hypxoemia and a high fever on 6/28.  The family tells me that she was treated for pneumonia with vancomycin and gentamicin.  During her hospitalization she had confusion, hypoxemia treated with BIPAP "for several days" and worsening renal failure.  She had an elevated troponin so she was transferred to the cardiology service here.  Prior to transfer she was given ativan and was noted to be confused on arrival to our hospital.  PCCM was consulted for hypoxemia requiring BIPAP and confusion and consideration of intubation.  The patient received flumazenil prior to my arrival and was starting to wake up more.    PAST MEDICAL HISTORY :  She  has a past medical history of Arthritis; Asthma; CHF (congestive heart failure) (HCC); CKD (chronic kidney disease), stage III; Depression; Diabetes mellitus without complication (HCC); Hypertension; MI (myocardial infarction) (HCC); Pneumonia (02/2017); and Stroke [facility](HCC).  PAST SURGICAL HISTORY: She  has a past surgical history that includes Abdominal hysterectomy and Cataract extraction.  Allergies  Allergen Reactions  .  Atorvastatin Other (See Comments)    Other reaction(s): Myalgias (intolerance)  . Penicillins Itching and Rash    Family members do not know the specifics  . Sulfur Itching and Rash    ITCHING & RASH    No current facility-administered medications on file prior to encounter.    Current Outpatient Prescriptions on File Prior to Encounter  Medication Sig  . AZOR 10-40 MG per tablet Take 1 tablet by mouth every morning.   . carvedilol (COREG) 25 MG tablet Take 25 mg by mouth daily.   . clopidogrel (PLAVIX) 75 MG tablet Take 75 mg by mouth daily.   . colchicine 0.6 MG tablet Take 0.6 mg by mouth daily.   . furosemide (LASIX) 40 MG tablet Take 40 mg by mouth 2 (two) times daily.   Marland Kitchen. HUMALOG KWIKPEN 100 UNIT/ML KiwkPen Inject 15-20 Units into the skin See admin instructions. 15 units at breakfast, 15 units at lunch, 20 units at dinner  . LANTUS SOLOSTAR 100 UNIT/ML Solostar Pen Inject 50 Units into the skin every morning.   . prednisoLONE acetate (PRED FORTE) 1 % ophthalmic suspension Place 1 drop into the right eye 2 (two) times daily.     FAMILY HISTORY/SOCIAL HISTORY/REVIEW OF SYSTEMS:   Cannot obtain due to confusion  SUBJECTIVE:  As above  VITAL SIGNS: BP (!) 167/99   Pulse 91   Temp 97.9 F (36.6 C) (Axillary)   Resp 19   SpO2 97%   HEMODYNAMICS:    VENTILATOR SETTINGS: FiO2 (%):  [60 %] 60 %  INTAKE / OUTPUT: No intake/output data recorded.  PHYSICAL EXAMINATION: General:  Obese, comfortable in bed Neuro: Sleepy but arouses to voice, conversant, follows commands, moves all four ext HEENT:  NCAT, R eye scarring, BIPAP mask in place Cardiovascular:  RRR, no mgr Lungs:  CTA B, diminished bases, BIPAP supported breathing Abdomen:  BS+, soft, nontender Musculoskeletal:  Normal bulk and tone Skin:  Some edema legs, sacral decubitus ulcer noted, not seen on my exam due to inability to turn patient over  LABS:  BMET No results for input(s): NA, K, CL, CO2, BUN,  CREATININE, GLUCOSE in the last 168 hours.  Electrolytes No results for input(s): CALCIUM, MG, PHOS in the last 168 hours.  CBC No results for input(s): WBC, HGB, HCT, PLT in the last 168 hours.  Coag's No results for input(s): APTT, INR in the last 168 hours.  Sepsis Markers No results for input(s): LATICACIDVEN, PROCALCITON, O2SATVEN in the last 168 hours.  ABG  Recent Labs Lab 02/10/17 1505  PHART 7.344*  PCO2ART 55.9*  PO2ART 56.6*    Liver Enzymes No results for input(s): AST, ALT, ALKPHOS, BILITOT, ALBUMIN in the last 168 hours.  Cardiac Enzymes No results for input(s): TROPONINI, PROBNP in the last 168 hours.  Glucose No results for input(s): GLUCAP in the last 168 hours.  Imaging No results found.   STUDIES:  7/1 CT chest> subsegmental atelectasis bases, but otherwise clear, some pulmonary nodules, indepenedently reviewed Multiple CXR images from [facility] reviewed showing worsening pulmonary edema and bilateral pleural effusions  CULTURES: 7/6 blood culture >   ANTIBIOTICS:   SIGNIFICANT EVENTS:   LINES/TUBES:   DISCUSSION:  83 y/o female with multiple comorbid illnesses admitted with acute on chronic respiratory failure with hypoxemia in the setting of HCAP and now progressive pulmonary edema, NSTEMI, acute CHF and likely acute on chronic renal failure.  She also has acute encephalopathy due to ativan which is improving.   She is not a good candidate for prolonged life support which would likely to be needed should her condition decline.  I advised the family of this at length today but there are in a bit of shock right now because of the fact that they have just arrived at our facility and her condition has changed relatively rapidly.  Fortunately because her mental status is improving, they would like to talk to her about her wishes for life support.  ASSESSMENT / PLAN:  PULMONARY A: HCAP (recent antibiotics) Acute on chronic  respiratory failure with hypercapnea and hypoxemia Acute pulmonary edema Likely obesity hyperventilation syndrome P:   Check CXR Check CBC Check blood cultures Give vanc/cefepime BIPAP for now ICU monitoring Will need BIPAP at night  Will need aggressive diuresis, will f/u BMET first  CARDIOVASCULAR A:  Demand ischemia P:  EKG, troponin per cardiology Repeat echo per cardiology  RENAL A:   Acute on chronic kidney failure  P:   Monitor BMET and UOP Replace electrolytes as needed Diuresis Strict I/O  GASTROINTESTINAL A:   No acute issues P:   NPO  HEMATOLOGIC A:   No acute issues P:  Check CBC  INFECTIOUS A:   HCAP  P:   Start vanc/cefepime Blood culture x2  ENDOCRINE A:   DM2 P:   SSI q4h  NEUROLOGIC A:   Acute encephalopathy due to ativan> improving P:   Hold sedating medications   FAMILY  - Updates: updated daughters and grandson at bedside at length today.  I advised that she would not do well with invasive mechanical ventilation and other ICU interventions.  They would prefer to talk to her about her wishes.  This is reasonable as her mental status is improving and I'm hopeful we can help her respiratory status with diuresis.  My cc time 40 minutes  Heber Bath, MD [place] [facility] Pager: [phone] Cell: [phone] After 3pm or if no response, call [phone]   02/10/2017, 4:36 PM

## 2017-02-10 NOTE — Progress Notes (Signed)
Pt arrived to rm 2h22 on bipap.  Pt is responsive to voice. No s/s of any acute distress at this time.

## 2017-02-10 NOTE — H&P (Addendum)
Cardiology Admission History and Physical:   Patient ID: Meagan Roberts; [id number]; 12-09-1926   Admission date: 02/10/2017  Primary Care Provider: Olive Bass, MD Primary Cardiologist:New to [facility]  Patient Profile:   Meagan Roberts is a 83 y.o. female with a history of CAD status post remote stenting, stroke, diabetes, hypertension, hyperlipidemia, and depression who transferred from [facility] for chest pain.  Patient was on 2 L oxygen at home for respiratory failure secondary to pneumonia. She was treated with antibiotic however symptoms worsen.  History of Present Illness:   [facility] admitted to [facility] 02/02/17 for acute hypoxic respiratory failure in setting of sepsis 2nd to pneumonia. She initially presented for fever greater than 104. She was started IV Rocephin and azithromycin. He was also given IV vancomycin and gentamicin. Her troponin peaked to 0.99. EKG with new left bundle-branch block. Cardiology consultant and felt demand ischemia in setting of acute illness. 2-D echocardiogram showed left ventricular EF of 50-55%, technically difficult study. cardiology signed off. However for the past few days she had intermittent chest pain and requested transfer to [facility]. Her creatinine peaked to 3.6 during admission. Patient was seen by general surgery ([facility]) or sacral decubitus ulce. Did not felt source of fever.    CT of chest showed multiple tiny pulmonary nodules. Aortic atherosclerosis in three-vessel disease.  Lab work today showed WBC 18.6, Hgb of 12.5, Na 142, K 4.7, Cr 1.2, Troponin I 0.93.  Chest x-ray today showed probable CHF with increased pulmonary edema. Patient currently on BiPAP. History obtained from reviewing chart and 2 daughter at bedside.  Past Medical History:  Diagnosis Date  . Asthma   . Depression   . Diabetes mellitus without complication   . Hypertension   . MI (myocardial infarction)   . Stroke     No  past surgical history on file.   Medications Prior to Admission: Prior to Admission medications   Medication Sig Start Date End Date Taking? Authorizing Provider  atorvastatin (LIPITOR) 40 MG tablet  06/30/14   [provider]  AZOR 10-40 MG per tablet  06/13/14   [provider]  B-D ULTRAFINE III SHORT PEN 31G X 8 MM MISC  06/13/14   [provider]  carvedilol (COREG) 25 MG tablet  06/03/14   [provider]  clopidogrel (PLAVIX) 75 MG tablet  06/13/14   [provider]  colchicine 0.6 MG tablet  07/03/14   [provider]  furosemide (LASIX) 40 MG tablet  06/03/14   [provider]  HUMALOG KWIKPEN 100 UNIT/ML KiwkPen  06/09/14   [provider]  LANTUS SOLOSTAR 100 UNIT/ML Solostar Pen  06/05/14   [provider]  ONE TOUCH ULTRA TEST test strip  06/09/14   [provider]  potassium chloride SA (K-DUR,KLOR-CON) 20 MEQ tablet Take 2 tablets (40 mEq total) by mouth 2 (two) times daily. 10/07/14   Meagan Mocha, MD  prednisoLONE acetate (PRED FORTE) 1 % ophthalmic suspension  07/14/14   [provider]  predniSONE (DELTASONE) 50 MG tablet Take 50 mg by mouth daily. 07/15/14   [provider]  traMADol Janean Sark) 50 MG tablet  06/27/14   [provider]     Allergies:   No Known Allergies  Social History:   Social History   Social History  . Marital status: Divorced    Spouse name: N/A  . Number of children: N/A  . Years of education: N/A  Occupational History  . Not on file.   Social History Main Topics  . Smoking status: Never Smoker  . Smokeless tobacco: Not on file  . Alcohol use No  . Drug use: No  . Sexual activity: Not on file   Other Topics Concern  . Not on file   Social History Narrative  . No narrative on file    Family History:   The patient's family history includes Hypertension in her father and mother.    ROS:  Please see the history of  present illness.  All other ROS reviewed and negative.     Physical Exam/Data:   Vitals:   02/10/17 1350 02/10/17 1400  BP: 134/82   Pulse: 95 95  Resp: 17 (!) 25  SpO2: 92% (!) 89%    General:  Obese female on BiPAP HEENT: normal Lymph: no adenopathy Neck: JVD difficult to assess due to great. Endocrine:  No thryomegaly Vascular: No carotid bruits; FA pulses 2+ bilaterally without bruits  Cardiac:  normal S1, S2; RRR; no murmur  Lungs:  clear to auscultation bilaterally, no wheezing, rhonchi or rales  Abd: soft, nontender, no hepatomegaly  Ext: 1+  edema Musculoskeletal:  No deformities, BUE and BLE strength normal and equal Skin: warm and dry  Neuro:  CNs 2-12 intact, no focal abnormalities noted Psych:  lethargic and sleepy   EKG:  The ECG is pending     Assessment and Plan:   1. Elevated Troponin/ chest pain with hx of remote CAD -Peak troponin of 0.99. Felt demand in setting of acute illness by cardiology at [facility]. Echocardiogram showed normal LV function.  - Will cycle troponin. Get EKG. Treat medically. Not candidate for invasive procedure correnlty.   2. Acute hypoxic respiratory failure - Respiratory on board. Currently on BiPAP. Will consult PCCM. Get ABGs.   3. Sepsis secondary to pneumonia - per consulting team.   4. Sacral decubitus ulcer - Did not for surgical candidate at [facility]. Per IM.  5. Hypertension - Currently stable.  6. Diabetes - SSI  Get EKG, CBC, CBG, BMP and BNP.  Severity of Illness: The appropriate patient status for this patient is INPATIENT. Inpatient status is judged to be reasonable and necessary in order to provide the required intensity of service to ensure the patient's safety. The patient's presenting symptoms, physical exam findings, and initial radiographic and laboratory data in the context of their chronic comorbidities is felt to place them at high risk for further clinical deterioration.  Furthermore, it is not anticipated that the patient will be medically stable for discharge from the [facility] within 2 midnights of admission. The following factors support the patient status of inpatient.   " The patient's presenting symptoms include Dyspnea. " The worrisome physical exam findings include obese. Volume ovarload " The initial radiographic and laboratory data are worrisome because of elevated troponin  " The chronic co-morbidities include CAD, DM, pneumonia, sepsis, ulcer   * I certify that at the point of admission it is my clinical judgment that the patient will require inpatient [facility] care spanning beyond 2 midnights from the point of admission due to high intensity of service, high risk for further deterioration and high frequency of surveillance required.*    Signed, [place], PA  02/10/2017 2:27 PM   I have seen and examined the patient along with Bhagat,Bhavinkumar, PA .  I have reviewed the chart, notes and new data.  I agree with PA's note.  Key new complaints: she  is sedated and very lethargic. History from notes and her daughters, present at bedside. Per their report, she was able to talk and communicate this AM, but was anxious. She received ativan en route due to agitation. Key examination changes: on BiPAP; able to awake only with sternal rub, but opens eyes and tracks. Severely obese and has generalized edema. No clear rales, hard to seen JVP. Sacral decubitus per report (seems to sick to turn over right now) Key new findings / data: CXR showed mild pulmonary congestion earlier today, per report. Labs from [place] reviewed. Troponin was 0.93 today, but was 0.75-0.86 on June 28 on admission. Creatinine has improved from 3 on admission to 1.3 today. ECG with LBBB. Only previous ECGs from 2016 show narrow QRS.  PLAN: She is in acute CHF and will require diuretics. She has multiple severe medical problems, but her respiratory status is the most immediate  concern. Per family, she would wish full code status, including mechanical ventilation if necessary. Will try to reverse the benzodiazepines. Will transfer to ICU. Consulted PCCM. Hopefully we can avoid intubation. WBC is elevated. Was being treated for sepsis/pneumonia. I do not see clear evidence of acute coronary sd, disadvantaged by LBBB. She is definitely not ready for coronary angiography.  She is acutely and critically ill, with complex decision making necessary.  Time spent with patient 60 minutes.  Thurmon FairMihai Kelin Nixon, MD, [facility] [phone] 02/10/2017, 3:41 PM

## 2017-02-10 NOTE — Progress Notes (Signed)
Patient transferring to 2H22 report called to Melody RN. Family made aware of transfer.  .mec

## 2017-02-10 NOTE — Progress Notes (Addendum)
Pharmacy Antibiotic Note  Meagan HartMary F Degidio is a 83 y.o. female transferred from [facility] on 02/10/2017 for further cardiac care.  Pharmacy has been consulted for vancomycin and cefepime dosing for PNA.  Per documentation, patient was recently treated for a respiratory infection.  Spoke to pharmacist from [facility] to obtain the following information: Azithromycin 500mg  Q24H, 6/28 > 7/2 CTX x1 6/28 Cefepime 2gm IV Q24H, 6/29 >>  (last dose today at 0845) Vanc 2gm x1 on 6/28 at 0200, then 1500mg  IV Q36H 6/29 at 1700, d/c'ed 6/30 Height, weight = 249 lbs, 4'8", BMI = 56 SCr: 1.2, CrCL 37 ml/min 6/28 UCx - negative 6/28 BCx - negative  Heparin 4000 units, then 1050 units/hr, started at 1130   Plan: - Reload Vanc with 2000mg  IV x 1, then 1250mg  IV Q24H for goal trough 15-20 mcg/mL - Cefepime 2gm IV Q24H, start tomorrow - Monitor renal fxn, clinical progress, vanc trough at [facility] - F/U with the need to continue IV heparin     Temp (24hrs), Avg:98.1 F (36.7 C), Min:97.9 F (36.6 C), Max:98.2 F (36.8 C)  No results for input(s): WBC, CREATININE, LATICACIDVEN, VANCOTROUGH, VANCOPEAK, VANCORANDOM, GENTTROUGH, GENTPEAK, GENTRANDOM, TOBRATROUGH, TOBRAPEAK, TOBRARND, AMIKACINPEAK, AMIKACINTROU, AMIKACIN in the last 168 hours.  CrCl cannot be calculated (Patient's most recent lab result is older than the maximum 21 days allowed.).    Allergies  Allergen Reactions  . Atorvastatin Other (See Comments)    Other reaction(s): Myalgias (intolerance)  . Penicillins Itching and Rash    Family members do not know the specifics  . Sulfur Itching and Rash    ITCHING & RASH     Chalsea Darko D. Laney Potashang, PharmD, BCPS Pager:  [phone] 02/10/2017, 5:17 PM    ===================   Addendum: - [facility] - confirmed with RN that heparin was infusing upon arrival and at 1050 units/hr - heparin dosing weight = 74 kg   Plan: - Check heparin level at 1930 - Daily heparin level  and CBC    Pasha Broad D. Laney Potashang, PharmD, BCPS Pager:  [phone] 02/10/2017, 5:22 PM

## 2017-02-11 DIAGNOSIS — J9600 Acute respiratory failure, unspecified whether with hypoxia or hypercapnia: Secondary | ICD-10-CM

## 2017-02-11 DIAGNOSIS — R748 Abnormal levels of other serum enzymes: Secondary | ICD-10-CM

## 2017-02-11 DIAGNOSIS — I5033 Acute on chronic diastolic (congestive) heart failure: Secondary | ICD-10-CM

## 2017-02-11 DIAGNOSIS — L8990 Pressure ulcer of unspecified site, unspecified stage: Secondary | ICD-10-CM | POA: Insufficient documentation

## 2017-02-11 DIAGNOSIS — J9611 Chronic respiratory failure with hypoxia: Secondary | ICD-10-CM

## 2017-02-11 LAB — CBC
HCT: 38.5 % (ref 36.0–46.0)
HCT: 36.3 % (ref 36.0–46.0)
Hemoglobin: 12.5 g/dL (ref 12.0–15.0)
Hemoglobin: 11.8 g/dL — ABNORMAL LOW (ref 12.0–15.0)
MCH: 25.7 pg — AB (ref 26.0–34.0)
MCH: 25.6 pg — AB (ref 26.0–34.0)
MCHC: 32.5 g/dL (ref 30.0–36.0)
MCHC: 32.5 g/dL (ref 30.0–36.0)
MCV: 79.2 fL (ref 78.0–100.0)
MCV: 78.7 fL (ref 78.0–100.0)
PLATELETS: 247 10*3/uL (ref 150–400)
PLATELETS: 252 10*3/uL (ref 150–400)
RBC: 4.86 MIL/uL (ref 3.87–5.11)
RBC: 4.61 MIL/uL (ref 3.87–5.11)
RDW: 16.6 % — ABNORMAL HIGH (ref 11.5–15.5)
RDW: 16.6 % — ABNORMAL HIGH (ref 11.5–15.5)
WBC: 13.9 10*3/uL — ABNORMAL HIGH (ref 4.0–10.5)
WBC: 13.2 10*3/uL — ABNORMAL HIGH (ref 4.0–10.5)

## 2017-02-11 LAB — BASIC METABOLIC PANEL
Anion gap: 10 (ref 5–15)
BUN: 21 mg/dL — AB (ref 6–20)
CALCIUM: 9.1 mg/dL (ref 8.9–10.3)
CO2: 30 mmol/L (ref 22–32)
CREATININE: 1.43 mg/dL — AB (ref 0.44–1.00)
Chloride: 100 mmol/L — ABNORMAL LOW (ref 101–111)
GFR calc Af Amer: 37 mL/min — ABNORMAL LOW (ref >60)
GFR calc non Af Amer: 32 mL/min — ABNORMAL LOW (ref >60)
GLUCOSE: 233 mg/dL — AB (ref 65–99)
Potassium: 4.1 mmol/L (ref 3.5–5.1)
Sodium: 140 mmol/L (ref 135–145)

## 2017-02-11 LAB — TROPONIN I
TROPONIN I: 7.88 ng/mL — AB (ref <0.03)
Troponin I: 6.06 ng/mL (ref <0.03)

## 2017-02-11 LAB — GLUCOSE, CAPILLARY
GLUCOSE-CAPILLARY: 207 mg/dL — AB (ref 65–99)
GLUCOSE-CAPILLARY: 202 mg/dL — AB (ref 65–99)
Glucose-Capillary: 230 mg/dL — ABNORMAL HIGH (ref 65–99)
Glucose-Capillary: 316 mg/dL — ABNORMAL HIGH (ref 65–99)
Glucose-Capillary: 337 mg/dL — ABNORMAL HIGH (ref 65–99)

## 2017-02-11 LAB — HEPARIN LEVEL (UNFRACTIONATED)
Heparin Unfractionated: 0.25 IU/mL — ABNORMAL LOW (ref 0.30–0.70)
Heparin Unfractionated: 0.59 IU/mL (ref 0.30–0.70)

## 2017-02-11 MED ORDER — POTASSIUM CHLORIDE CRYS ER 20 MEQ PO TBCR
40.0000 meq | EXTENDED_RELEASE_TABLET | Freq: Two times a day (BID) | ORAL | Status: DC
  Administered 2017-02-11: 40 meq via ORAL
  Filled 2017-02-11: qty 2

## 2017-02-11 MED ORDER — WHITE PETROLATUM GEL
Status: AC
  Administered 2017-02-11: 08:00:00
  Filled 2017-02-11: qty 1

## 2017-02-11 MED ORDER — INSULIN ASPART 100 UNIT/ML ~~LOC~~ SOLN
0.0000 [IU] | Freq: Every day | SUBCUTANEOUS | Status: DC
  Administered 2017-02-11 – 2017-02-12 (×2): 4 [IU] via SUBCUTANEOUS
  Administered 2017-02-15: 2 [IU] via SUBCUTANEOUS
  Administered 2017-02-18: 3 [IU] via SUBCUTANEOUS

## 2017-02-11 MED ORDER — FUROSEMIDE 10 MG/ML IJ SOLN
INTRAMUSCULAR | Status: AC
  Administered 2017-02-11: 40 mg via INTRAVENOUS
  Filled 2017-02-11: qty 4

## 2017-02-11 MED ORDER — HYDRALAZINE HCL 10 MG PO TABS
10.0000 mg | ORAL_TABLET | Freq: Three times a day (TID) | ORAL | Status: DC
  Administered 2017-02-11 – 2017-02-12 (×3): 10 mg via ORAL
  Filled 2017-02-11 (×3): qty 1

## 2017-02-11 MED ORDER — PRO-STAT SUGAR FREE PO LIQD
30.0000 mL | Freq: Two times a day (BID) | ORAL | Status: DC
  Administered 2017-02-11 – 2017-02-14 (×3): 30 mL via ORAL
  Filled 2017-02-11 (×3): qty 30

## 2017-02-11 MED ORDER — FUROSEMIDE 10 MG/ML IJ SOLN
40.0000 mg | Freq: Once | INTRAMUSCULAR | Status: AC
  Administered 2017-02-11: 40 mg via INTRAVENOUS

## 2017-02-11 MED ORDER — FUROSEMIDE 10 MG/ML IJ SOLN
40.0000 mg | Freq: Two times a day (BID) | INTRAMUSCULAR | Status: DC
  Administered 2017-02-11 – 2017-02-12 (×3): 40 mg via INTRAVENOUS
  Filled 2017-02-11 (×3): qty 4

## 2017-02-11 MED ORDER — ENSURE ENLIVE PO LIQD
237.0000 mL | Freq: Two times a day (BID) | ORAL | Status: DC
  Administered 2017-02-12 – 2017-02-20 (×7): 237 mL via ORAL

## 2017-02-11 MED ORDER — INSULIN ASPART 100 UNIT/ML ~~LOC~~ SOLN
0.0000 [IU] | Freq: Three times a day (TID) | SUBCUTANEOUS | Status: DC
  Administered 2017-02-12: 7 [IU] via SUBCUTANEOUS
  Administered 2017-02-12 (×2): 20 [IU] via SUBCUTANEOUS
  Administered 2017-02-13: 7 [IU] via SUBCUTANEOUS
  Administered 2017-02-13: 4 [IU] via SUBCUTANEOUS
  Administered 2017-02-13: 7 [IU] via SUBCUTANEOUS
  Administered 2017-02-14: 3 [IU] via SUBCUTANEOUS
  Administered 2017-02-14: 4 [IU] via SUBCUTANEOUS
  Administered 2017-02-14: 7 [IU] via SUBCUTANEOUS
  Administered 2017-02-15 (×2): 3 [IU] via SUBCUTANEOUS
  Administered 2017-02-16 (×3): 4 [IU] via SUBCUTANEOUS
  Administered 2017-02-17: 3 [IU] via SUBCUTANEOUS
  Administered 2017-02-17: 7 [IU] via SUBCUTANEOUS
  Administered 2017-02-17: 4 [IU] via SUBCUTANEOUS
  Administered 2017-02-18: 7 [IU] via SUBCUTANEOUS
  Administered 2017-02-18: 3 [IU] via SUBCUTANEOUS
  Administered 2017-02-18: 4 [IU] via SUBCUTANEOUS
  Administered 2017-02-19: 7 [IU] via SUBCUTANEOUS
  Administered 2017-02-19 – 2017-02-20 (×2): 3 [IU] via SUBCUTANEOUS
  Administered 2017-02-20: 4 [IU] via SUBCUTANEOUS

## 2017-02-11 NOTE — Progress Notes (Signed)
PULMONARY / CRITICAL CARE MEDICINE   Name: Meagan Roberts MRN: [id number] DOB: [birth year]    ADMISSION DATE:  02/10/2017 CONSULTATION DATE:  02/10/2017  REFERRING MD:  Croitoru  CHIEF COMPLAINT:  Dyspnea, confusion  BRIEF SUMMARY:  83 y/o female who initially presented to [facility] 6/28 with lethargy, chest pain, hypoxemia and fever. She was admitted for NSTEMI, acute on chronic kidney failure and acute respiratory failure with hypoxemia. During hospitalization she was treated for PNA with Vanco / Zosyn and BiPAP for "several days".  Troponin was elevated and she was transferred to [facility] 7/6 for Cardiology evaluation.  Note, she had lethargy that responded to flumazenil.    PMH:  CVA, DM, HTN, Depression, CKD III, CHF, arthritis, Asthma   SUBJECTIVE: RN reports patient anxious, remains on BiPAP 16/7, 35%.  No acute events overnight.    VITAL SIGNS: BP 140/88 (BP Location: Left Arm)   Pulse (!) 110   Temp 97.7 F (36.5 C) (Axillary)   Resp (!) 25   Ht 5\' 5"  (1.651 m)   Wt 247 lb 5.7 oz (112.2 kg)   SpO2 96%   BMI 41.16 kg/m   HEMODYNAMICS:    VENTILATOR SETTINGS: FiO2 (%):  [40 %-60 %] 40 %  INTAKE / OUTPUT: I/O last 3 completed shifts: In: 131.8 [I.V.:131.8] Out: 2730 [Urine:2730]  PHYSICAL EXAMINATION: General: obese elderly female in NAD on bipap HEENT: MM pink/moist, bipap mask in place PSY: anxious Neuro: Awake, alert / appropriate, MAE, generalized weakness  CV: s1s2 rrr, no m/r/g PULM: even/non-labored, lungs bilaterally diminished, bibasilar crackles  WU:JWJX, non-tender, bsx4 active  Extremities: warm/dry, 1-2+ pitting edema  Skin: no rashes or lesions   LABS:  BMET  Recent Labs Lab 02/10/17 1814  NA 137  K 4.3  CL 102  CO2 28  BUN 21*  CREATININE 1.47*  GLUCOSE 291*    Electrolytes  Recent Labs Lab 02/10/17 1814  CALCIUM 9.0    CBC  Recent Labs Lab 02/11/17 0122 02/11/17 0728  WBC 13.9* 13.2*  HGB 12.5 11.8*  HCT 38.5  36.3  PLT 247 252    Coag's No results for input(s): APTT, INR in the last 168 hours.  Sepsis Markers  Recent Labs Lab 02/10/17 1814  LATICACIDVEN 1.6    ABG  Recent Labs Lab 02/10/17 1505  PHART 7.344*  PCO2ART 55.9*  PO2ART 56.6*    Liver Enzymes No results for input(s): AST, ALT, ALKPHOS, BILITOT, ALBUMIN in the last 168 hours.  Cardiac Enzymes  Recent Labs Lab 02/10/17 1814 02/11/17 0122  TROPONINI 4.66* 7.88*    Glucose  Recent Labs Lab 02/10/17 2209 02/11/17 0618 02/11/17 0737  GLUCAP 265* 230* 207*    Imaging Dg Chest Port 1 View  Result Date: 02/10/2017 CLINICAL DATA:  Dyspnea. EXAM: PORTABLE CHEST 1 VIEW COMPARISON:  02/10/2017 at 0814 hours FINDINGS: Patient rotated minimally right. Midline trachea. Cardiomegaly accentuated by AP portable technique. Small layering bilateral pleural effusions. No pneumothorax. Moderate interstitial edema is not significantly changed. Bibasilar airspace disease is similar, given differences in technique. Mildly degraded exam due to AP portable technique and patient body habitus. IMPRESSION: No significant change since earlier today. Congestive heart failure with layering bilateral pleural effusions and bibasilar airspace disease. Electronically Signed   By: Jeronimo Greaves M.D.   On: 02/10/2017 16:37     STUDIES:  7/1 CT chest >> subsegmental atelectasis bases, but otherwise clear, some pulmonary nodules, indepenedently reviewed Multiple CXR images from [facility] reviewed showing worsening pulmonary  edema and bilateral pleural effusions  CULTURES: BCx2 7/6 >>   ANTIBIOTICS: Cefepime 7/6 >>  Vanco 7/6 >> 7/7  SIGNIFICANT EVENTS:   LINES/TUBES:   DISCUSSION: 83 y/o female with multiple comorbid illnesses admitted with acute on chronic respiratory failure with hypoxemia in the setting of HCAP and now progressive pulmonary edema, NSTEMI, acute CHF and likely acute on chronic renal failure.  She also had  acute encephalopathy due to ativan which is resolved.  Requiring intermittent BiPAP support    ASSESSMENT / PLAN:  PULMONARY A: HCAP (recent antibiotics) Acute on chronic respiratory failure with hypercapnea and hypoxemia Acute pulmonary edema Likely obesity hyperventilation syndrome P: Doubt active PNA > CXR consistent with layering effusions, edema  See ID  Wean O2 for sats > 90% Intermittent CXR BiPAP support PRN for increased work of breathing  Additional lasix dosing now, 40mg  IV  CARDIOVASCULAR A:  NSTEMI  P:  Recommendations per Cardiology Heparin gtt  Trend troponin, EKG  ICU monitoring  RENAL A:   Acute on chronic kidney failure  P:   Trend BMP / urinary output Replace electrolytes as indicated Avoid nephrotoxic agents, ensure adequate renal perfusion  GASTROINTESTINAL A:   Obesity P:   Diet as tolerated when off bipap  HEMATOLOGIC A:   No acute issues P:  Trend CBC Heparin gtt per pharmacy   INFECTIOUS A:   HCAP  Sacral Decubitus Ulcer P:  ABX as above D/c vanco 7/7 Doubt current infectious process > CXR c/w edema, WBC 13.2.  Will continue cefepime for now.  Follow blood cultures  WOC following for ulcer  ENDOCRINE A:   DM2 P:   SSI   NEUROLOGIC A:   Acute encephalopathy due to ativan> improving P:   Minimize sedating medications  Supportive care   FAMILY  - Updates:  Granddaughter updated at bedside am 7/7 on NP rounds  CC Time: 30 minutes    Canary BrimBrandi Johnny Latu, NP-C [place] [facility] Pgr: [phone] or if no answer [phone] 02/11/2017, 8:25 AM

## 2017-02-11 NOTE — Progress Notes (Signed)
ANTICOAGULATION CONSULT NOTE - Follow Up Consult  Pharmacy Consult for heparin Indication: nstemi  Allergies  Allergen Reactions  . Atorvastatin Other (See Comments)    Other reaction(s): Myalgias (intolerance)  . Penicillins Itching and Rash    Family members do not know the specifics  . Sulfur Itching and Rash    ITCHING & RASH    Patient Measurements: Height: 5\' 5"  (165.1 cm) Weight: 247 lb 5.7 oz (112.2 kg) IBW/kg (Calculated) : 57  Vital Signs: Temp: 97.7 F (36.5 C) (07/07 0735) Temp Source: Axillary (07/07 0735) BP: 140/88 (07/07 0800) Pulse Rate: 105 (07/07 0815)  Labs:  Recent Labs  02/10/17 1814 02/11/17 0122 02/11/17 0447 02/11/17 0728  HGB  --  12.5  --  11.8*  HCT  --  38.5  --  36.3  PLT  --  247  --  252  HEPARINUNFRC 0.47  --  0.25*  --   CREATININE 1.47*  --   --  1.43*  TROPONINI 4.66* 7.88*  --  6.06*    Estimated Creatinine Clearance: 34.6 mL/min (A) (by C-G formula based on SCr of 1.43 mg/dL (H)).  Assessment: 83 year old female [facility].   Patient is currently receiving IV heparin for nstemi. Heparin level is low this morning, will adjust rate. CBC is stable overnight and no bleeding complications have been noted.   Goal of Therapy:  Heparin level 0.3-0.7 units/ml Monitor platelets by anticoagulation protocol: Yes   Plan:  Increase heparin to 1250 units/hr Recheck heparin level in 8 hours Follow up length of therapy if no procedures are planned  Sheppard CoilFrank Corsica Franson PharmD., BCPS Clinical Pharmacist Pager [phone] 02/11/2017 8:59 AM

## 2017-02-11 NOTE — Progress Notes (Signed)
Initial Nutrition Assessment  DOCUMENTATION CODES:  Obesity unspecified  INTERVENTION:  Will order 30 mL Prostat BID, each supplement provides 100 kcal and 15 grams of protein.  Ensure Enlive po BID, each supplement provides 350 kcal and 20 grams of protein  NUTRITION DIAGNOSIS:  Increased nutrient needs related to wound healing, acute illness (Sepsis) as evidenced by estimated nutritional requirements for these conditions  GOAL:  Patient will meet greater than or equal to 90% of their needs  MONITOR:  PO intake, Supplement acceptance, I & O's, Labs, Weight trends, Skin  REASON FOR ASSESSMENT:  Consult Assessment of nutrition requirement/status  ASSESSMENT:  83 y/o female PMHx CAD, DM, HTN, HLD, Depression, CVA, MI. Ad[facility] for respiratory failure from sepsis r/t PNA. Found to have new Left BBB. She was [facility]. On arrival, CXR shows probable CHF w/ Pulmonary edema. Rd consulted for nutritional assessment.   Pt is on Bipap and as such has great difficulty communicating. She has a family member in the room who gives history.   She says that the patient was on a regular diet up until 3 days ago. She then apparently had some signs of dysphagia and what placed on a modified diet with thickened liquids. THe patient did not eat very well with these modifications. On top of this, she started suffering from severe SOB and essentially has not had intake x3 days.   The family member states the patient used to take a vitamin/mineral, but is unsure if she is now. The patient did not drink Ensure or boost at home.   Family member thought UBW of 225 lbs, which is consistent with her  Chart review and care everywhere; UBW appears to be 225-235 lbs. She has fluid overload/swelling, will use 235 lbs (106.82 kg) for dosing weight.   Do not believe she is malnourished at this time, but is at high risk due to respiratory failure and  inability to eat. RD will monitor for diet progression and intake and follow up as warranted.   Physical Exam: Obese, edema.   Meds: Lasix, Insulin, ppi, IV abx, Labs: Wbc: 13.2, bun/creat:21/1.43, BGs now 200-230   Recent Labs Lab 02/10/17 1814 02/11/17 0728  NA 137 140  K 4.3 4.1  CL 102 100*  CO2 28 30  BUN 21* 21*  CREATININE 1.47* 1.43*  CALCIUM 9.0 9.1  GLUCOSE 291* 233*   Diet Order:  Diet heart healthy/carb modified Room service appropriate? Yes; Fluid consistency: Thin  Skin: PU stage 2 to sacrum  Last BM:  7/6  Height:  Ht Readings from Last 1 Encounters:  02/10/17 5\' 5"  (1.651 m)   Weight:  Wt Readings from Last 1 Encounters:  02/11/17 247 lb 5.7 oz (112.2 kg)   Wt Readings from Last 10 Encounters:  02/11/17 247 lb 5.7 oz (112.2 kg)  10/07/14 230 lb (104.3 kg)   Further wt hx from Care Everywhere 12/02/16:227 lbs 11/28/16:225 lbs 07/24/17:235 lbs 02/11/16:232 lbs  Ideal Body Weight:  56.82 kg  BMI:  Body mass index using dosing weight is 39.2 kg/m.  Estimated Nutritional Needs:  Kcal:  1800-2000 kcals (17-19 kcal/kg bw) Protein:  85-97 (1.5-1.7 g/kg ibw) Fluid:  Per MD  EDUCATION NEEDS:  No education needs identified at this time  Christophe LouisNathan Arielis Leonhart RD, LDN, CNSC Clinical Nutrition Pager: [phone] 02/11/2017 12:27 PM

## 2017-02-11 NOTE — Progress Notes (Signed)
ANTICOAGULATION CONSULT NOTE  Pharmacy Consult for heparin Indication: nstemi  Allergies  Allergen Reactions  . Atorvastatin Other (See Comments)    Other reaction(s): Myalgias (intolerance)  . Penicillins Itching and Rash    Family members do not know the specifics  . Sulfur Itching and Rash    ITCHING & RASH    Patient Measurements: Height: 5\' 5"  (165.1 cm) Weight: 247 lb 5.7 oz (112.2 kg) IBW/kg (Calculated) : 57  Vital Signs: Temp: 97.7 F (36.5 C) (07/07 1154) Temp Source: Axillary (07/07 1154) BP: 154/86 (07/07 1700) Pulse Rate: 92 (07/07 1715)  Labs:  Recent Labs  02/10/17 1814 02/11/17 0122 02/11/17 0447 02/11/17 0728 02/11/17 1642  HGB  --  12.5  --  11.8*  --   HCT  --  38.5  --  36.3  --   PLT  --  247  --  252  --   HEPARINUNFRC 0.47  --  0.25*  --  0.59  CREATININE 1.47*  --   --  1.43*  --   TROPONINI 4.66* 7.88*  --  6.06*  --     Estimated Creatinine Clearance: 34.6 mL/min (A) (by C-G formula based on SCr of 1.43 mg/dL (H)).  Assessment: 83 [facility].  Patient is currently receiving IV heparin for nstemi. Therapeutic on current heparin rate. hgb 11.8, plts wnl   Goal of Therapy:  Heparin level 0.3-0.7 units/ml Monitor platelets by anticoagulation protocol: Yes    Plan:  Continue heparin at 1250 units/hr Daily HL, CBC    Agapito GamesAlison Jaydeen Odor, PharmD, BCPS Clinical Pharmacist 02/11/2017 5:44 PM

## 2017-02-11 NOTE — Progress Notes (Signed)
eLink Physician-Brief Progress Note Patient Name: Meagan HartMary F Cipollone DOB: 10/29/1926 MRN: [id number]   Date of Service  02/11/2017  HPI/Events of Note  Hyperglycemia - Blood glucose = 203 --> 202 --> 316 --> 337.  eICU Interventions  Will increase to resistant AC/HS Novolog SSI .     Intervention Category Major Interventions: Hyperglycemia - active titration of insulin therapy  Caroly Purewal Dennard Nipugene 02/11/2017, 9:50 PM

## 2017-02-11 NOTE — Progress Notes (Signed)
Progress Note  Patient Name: Meagan Roberts Date of Encounter: 02/11/2017  Primary Cardiologist:   New (Dr. Royann Shivers)  Subjective   She was agitated this morning and wanted to be off of the BiPAP.  She is breathing better and on [place] now.  No pain.   Inpatient Medications    Scheduled Meds: . amLODipine  10 mg Oral Daily  . aspirin EC  81 mg Oral Daily  . carvedilol  25 mg Oral Daily  . chlorhexidine  15 mL Mouth Rinse BID  . clopidogrel  75 mg Oral Daily  . colchicine  0.6 mg Oral Daily  . gabapentin  300 mg Oral QHS  . insulin aspart  0-15 Units Subcutaneous TID WC  . insulin aspart  0-5 Units Subcutaneous QHS  . irbesartan  300 mg Oral Daily  . mouth rinse  15 mL Mouth Rinse q12n4p  . pantoprazole  40 mg Oral BID  . prednisoLONE acetate  1 drop Right Eye BID   Continuous Infusions: . ceFEPime (MAXIPIME) IV Stopped (02/11/17 0830)  . heparin 1,250 Units/hr (02/11/17 1000)   PRN Meds: acetaminophen, nitroGLYCERIN, ondansetron (ZOFRAN) IV   Vital Signs    Vitals:   02/11/17 0815 02/11/17 0900 02/11/17 1000 02/11/17 1030  BP:  (!) 126/106 (!) 151/84 (!) 156/89  Pulse: (!) 105 (!) 102 97 95  Resp: (!) 23 (!) 21 (!) 0 19  Temp:      TempSrc:      SpO2: 97% 98% 100% 97%  Weight:      Height:        Intake/Output Summary (Last 24 hours) at 02/11/17 1114 Last data filed at 02/11/17 1000  Gross per 24 hour  Intake           215.81 ml  Output             2730 ml  Net         -2514.19 ml   Filed Weights   02/11/17 0500  Weight: 247 lb 5.7 oz (112.2 kg)    Telemetry    NSR, sinus tach - Personally Reviewed  ECG    NA - Personally Reviewed  Physical Exam   GEN: No acute distress.   Neck: No  JVD Cardiac: RRR, no murmurs, rubs, or gallops.  Respiratory: Decreased breath sounds without crackles GI: Soft, nontender, non-distended  MS: No  edema; No deformity. Neuro:  Nonfocal  Psych: Normal affect   Labs    Chemistry Recent Labs Lab 02/10/17 1814  02/11/17 0728  NA 137 140  K 4.3 4.1  CL 102 100*  CO2 28 30  GLUCOSE 291* 233*  BUN 21* 21*  CREATININE 1.47* 1.43*  CALCIUM 9.0 9.1  GFRNONAA 31* 32*  GFRAA 36* 37*  ANIONGAP 7 10     Hematology Recent Labs Lab 02/11/17 0122 02/11/17 0728  WBC 13.9* 13.2*  RBC 4.86 4.61  HGB 12.5 11.8*  HCT 38.5 36.3  MCV 79.2 78.7  MCH 25.7* 25.6*  MCHC 32.5 32.5  RDW 16.6* 16.6*  PLT 247 252    Cardiac Enzymes Recent Labs Lab 02/10/17 1814 02/11/17 0122 02/11/17 0728  TROPONINI 4.66* 7.88* 6.06*   No results for input(s): TROPIPOC in the last 168 hours.   BNP Recent Labs Lab 02/10/17 1814  BNP 331.9*     DDimer No results for input(s): DDIMER in the last 168 hours.   Radiology    Dg Chest Port 1 View  Result Date: 02/10/2017  CLINICAL DATA:  Dyspnea. EXAM: PORTABLE CHEST 1 VIEW COMPARISON:  02/10/2017 at 0814 hours FINDINGS: Patient rotated minimally right. Midline trachea. Cardiomegaly accentuated by AP portable technique. Small layering bilateral pleural effusions. No pneumothorax. Moderate interstitial edema is not significantly changed. Bibasilar airspace disease is similar, given differences in technique. Mildly degraded exam due to AP portable technique and patient body habitus. IMPRESSION: No significant change since earlier today. Congestive heart failure with layering bilateral pleural effusions and bibasilar airspace disease. Electronically Signed   By: Jeronimo GreavesKyle  Talbot M.D.   On: 02/10/2017 16:37    Cardiac Studies   NA  Patient Profile     83 y.o. female with a history of CAD status post remote stenting, stroke, diabetes, hypertension, hyperlipidemia, and depression who [facility].  Assessment & Plan    ACUTE DYSPNEA:  Improved.  She was given extra IV Lasix this morning.  Now off of BiPAP.  Continue to follow .  Repeat CXR is ordered.  I/O are incomplete.  I will give IV bid Lasix today.    NSTEMI:   Not clear that  this was an acute cardiac event vs demand ischemia.  Continue heparin.  Not planning an invasive evaluation at this point.  Trop trend is flat.  Echo at outside with  NL LV function.   Likely cath before discharge if all else stabilizes.    CKD stage III:  Creat is stable.  Follow creat.    HTN:  BP is not controlled.  I will add hydralazine.  Continue other meds as listed.   Signed, Rollene RotundaJames Jailyn Leeson, MD  02/11/2017, 11:14 AM

## 2017-02-12 ENCOUNTER — Inpatient Hospital Stay (HOSPITAL_COMMUNITY): Payer: Medicare HMO

## 2017-02-12 DIAGNOSIS — I5042 Chronic combined systolic (congestive) and diastolic (congestive) heart failure: Secondary | ICD-10-CM

## 2017-02-12 DIAGNOSIS — R0600 Dyspnea, unspecified: Secondary | ICD-10-CM

## 2017-02-12 DIAGNOSIS — R0603 Acute respiratory distress: Secondary | ICD-10-CM

## 2017-02-12 DIAGNOSIS — I5031 Acute diastolic (congestive) heart failure: Secondary | ICD-10-CM

## 2017-02-12 DIAGNOSIS — J810 Acute pulmonary edema: Secondary | ICD-10-CM

## 2017-02-12 DIAGNOSIS — R739 Hyperglycemia, unspecified: Secondary | ICD-10-CM

## 2017-02-12 LAB — CBC
HCT: 35.3 % — ABNORMAL LOW (ref 36.0–46.0)
Hemoglobin: 11.2 g/dL — ABNORMAL LOW (ref 12.0–15.0)
MCH: 25.1 pg — AB (ref 26.0–34.0)
MCHC: 31.7 g/dL (ref 30.0–36.0)
MCV: 79 fL (ref 78.0–100.0)
PLATELETS: 303 10*3/uL (ref 150–400)
RBC: 4.47 MIL/uL (ref 3.87–5.11)
RDW: 16.6 % — AB (ref 11.5–15.5)
WBC: 13.8 10*3/uL — AB (ref 4.0–10.5)

## 2017-02-12 LAB — BASIC METABOLIC PANEL
ANION GAP: 8 (ref 5–15)
BUN: 21 mg/dL — AB (ref 6–20)
CALCIUM: 9.1 mg/dL (ref 8.9–10.3)
CO2: 33 mmol/L — ABNORMAL HIGH (ref 22–32)
Chloride: 95 mmol/L — ABNORMAL LOW (ref 101–111)
Creatinine, Ser: 1.59 mg/dL — ABNORMAL HIGH (ref 0.44–1.00)
GFR calc Af Amer: 33 mL/min — ABNORMAL LOW (ref >60)
GFR, EST NON AFRICAN AMERICAN: 28 mL/min — AB (ref >60)
GLUCOSE: 241 mg/dL — AB (ref 65–99)
Potassium: 3.7 mmol/L (ref 3.5–5.1)
Sodium: 136 mmol/L (ref 135–145)

## 2017-02-12 LAB — GLUCOSE, CAPILLARY
GLUCOSE-CAPILLARY: 243 mg/dL — AB (ref 65–99)
GLUCOSE-CAPILLARY: 392 mg/dL — AB (ref 65–99)
Glucose-Capillary: 391 mg/dL — ABNORMAL HIGH (ref 65–99)
Glucose-Capillary: 348 mg/dL — ABNORMAL HIGH (ref 65–99)

## 2017-02-12 LAB — HEPARIN LEVEL (UNFRACTIONATED): Heparin Unfractionated: 0.54 IU/mL (ref 0.30–0.70)

## 2017-02-12 MED ORDER — ALPRAZOLAM 0.25 MG PO TABS
0.2500 mg | ORAL_TABLET | Freq: Once | ORAL | Status: AC
  Administered 2017-02-12: 0.25 mg via ORAL
  Filled 2017-02-12: qty 1

## 2017-02-12 MED ORDER — INSULIN GLARGINE 100 UNIT/ML ~~LOC~~ SOLN
20.0000 [IU] | Freq: Every day | SUBCUTANEOUS | Status: DC
  Administered 2017-02-12 – 2017-02-16 (×4): 20 [IU] via SUBCUTANEOUS
  Filled 2017-02-12 (×5): qty 0.2

## 2017-02-12 MED ORDER — FUROSEMIDE 10 MG/ML IJ SOLN
40.0000 mg | Freq: Once | INTRAMUSCULAR | Status: AC
  Administered 2017-02-12: 40 mg via INTRAVENOUS
  Filled 2017-02-12: qty 4

## 2017-02-12 MED ORDER — HYDRALAZINE HCL 25 MG PO TABS
25.0000 mg | ORAL_TABLET | Freq: Four times a day (QID) | ORAL | Status: DC
  Administered 2017-02-12 – 2017-02-19 (×27): 25 mg via ORAL
  Filled 2017-02-12 (×27): qty 1

## 2017-02-12 MED ORDER — POTASSIUM CHLORIDE CRYS ER 20 MEQ PO TBCR
40.0000 meq | EXTENDED_RELEASE_TABLET | Freq: Once | ORAL | Status: AC
  Administered 2017-02-12: 40 meq via ORAL

## 2017-02-12 MED ORDER — ALUM & MAG HYDROXIDE-SIMETH 200-200-20 MG/5ML PO SUSP
30.0000 mL | ORAL | Status: DC | PRN
  Administered 2017-02-12: 30 mL via ORAL
  Filled 2017-02-12: qty 30

## 2017-02-12 NOTE — Progress Notes (Signed)
PULMONARY / CRITICAL CARE MEDICINE   Name: Meagan HartMary F Roberts MRN: [id number] DOB: 06/27/1927    ADMISSION DATE:  02/10/2017 CONSULTATION DATE:  02/10/2017  REFERRING MD:  Croitoru  CHIEF COMPLAINT:  Dyspnea, confusion  BRIEF SUMMARY:  83 y/o female who [facility] with lethargy, chest pain, hypoxemia and fever. She was admitted for NSTEMI, acute on chronic kidney failure and acute respiratory failure with hypoxemia. During hospitalization she was treated for PNA with Vanco / Zosyn and BiPAP for "several days".  Troponin was elevated and [facility].  Note, she had lethargy that responded to flumazenil.    PMH:  CVA, DM, HTN, Depression, CKD III, CHF, arthritis, Asthma   SUBJECTIVE:  Pt reports feeling much better.  Denies SOB.  Eating breakfast.  Family at bedside note improvement.  RN reports elevated glucose.  Wore bipap overnight. Net neg 3.8L since admit   VITAL SIGNS: BP (!) 156/88   Pulse (!) 102   Temp 98.4 F (36.9 C) (Oral)   Resp (!) 25   Ht 5\' 5"  (1.651 m)   Wt 238 lb 8.6 oz (108.2 kg)   SpO2 96%   BMI 39.69 kg/m   HEMODYNAMICS:    VENTILATOR SETTINGS: FiO2 (%):  [35 %] 35 %  INTAKE / OUTPUT: I/O last 3 completed shifts: In: 1432.5 [P.O.:960; I.V.:422.5; IV Piggyback:50] Out: 4900 [Urine:4900]  PHYSICAL EXAMINATION: General: elderly female in NAD, sitting up eating breakfast  HEENT: MM pink/moist PSY: calm/appropriate Neuro: AAOx4, speech clear, MAE CV: s1s2 rrr, no m/r/g PULM: even/non-labored, lungs bilaterally clear, diminished lower  NW:GNFAGI:soft, non-tender, bsx4 active  Extremities: warm/dry, 1+ LE edema  Skin: no rashes or lesions  LABS:  BMET  Recent Labs Lab 02/10/17 1814 02/11/17 0728 02/12/17 0206  NA 137 140 136  K 4.3 4.1 3.7  CL 102 100* 95*  CO2 28 30 33*  BUN 21* 21* 21*  CREATININE 1.47* 1.43* 1.59*  GLUCOSE 291* 233* 241*    Electrolytes  Recent  Labs Lab 02/10/17 1814 02/11/17 0728 02/12/17 0206  CALCIUM 9.0 9.1 9.1    CBC  Recent Labs Lab 02/11/17 0122 02/11/17 0728 02/12/17 0206  WBC 13.9* 13.2* 13.8*  HGB 12.5 11.8* 11.2*  HCT 38.5 36.3 35.3*  PLT 247 252 303    Coag's No results for input(s): APTT, INR in the last 168 hours.  Sepsis Markers  Recent Labs Lab 02/10/17 1814  LATICACIDVEN 1.6    ABG  Recent Labs Lab 02/10/17 1505  PHART 7.344*  PCO2ART 55.9*  PO2ART 56.6*    Liver Enzymes No results for input(s): AST, ALT, ALKPHOS, BILITOT, ALBUMIN in the last 168 hours.  Cardiac Enzymes  Recent Labs Lab 02/10/17 1814 02/11/17 0122 02/11/17 0728  TROPONINI 4.66* 7.88* 6.06*    Glucose  Recent Labs Lab 02/11/17 0618 02/11/17 0737 02/11/17 1152 02/11/17 1713 02/11/17 2129 02/12/17 0757  GLUCAP 230* 207* 202* 316* 337* 243*    Imaging Dg Chest Port 1 View  Result Date: 02/12/2017 CLINICAL DATA:  Acute respiratory failure EXAM: PORTABLE CHEST 1 VIEW COMPARISON:  Two days ago FINDINGS: Cardiopericardial enlargement, similar to prior when accounting for rotation. There is diffuse interstitial opacity. Haziness of the chest attributed to layering pleural effusions. No pneumothorax. IMPRESSION: CHF. Increased hazy density of the chest suggests layering bilateral effusions. Electronically Signed   By: Marnee SpringJonathon  Watts M.D.   On: 02/12/2017 07:19     STUDIES:  7/1 CT  chest >> subsegmental atelectasis bases, but otherwise clear, some pulmonary nodules, indepenedently reviewed Multiple CXR images from [facility] reviewed showing worsening pulmonary edema and bilateral pleural effusions  CULTURES: BCx2 7/6 >>   ANTIBIOTICS: Cefepime 7/6 >> 7/8 Vanco 7/6 >> 7/7  SIGNIFICANT EVENTS: 7/06  Transfer with PNA, CHF, NSTEMI  LINES/TUBES:   DISCUSSION: 83 y/o female with multiple comorbid illnesses admitted with acute on chronic respiratory failure with hypoxemia in the setting of  HCAP and now progressive pulmonary edema, NSTEMI, acute CHF and likely acute on chronic renal failure.  She also had acute encephalopathy due to ativan which is resolved.  Requiring intermittent BiPAP support    ASSESSMENT / PLAN:  PULMONARY A: HCAP (recent antibiotics) Acute on chronic respiratory failure with hypercapnea and hypoxemia Acute pulmonary edema Likely obesity hypoventilation syndrome P: Wean O2 for sats > 90% Intermittent CXR BiPAP QHS & PRN  Will need outpatient sleep study See ID  CARDIOVASCULAR A:  NSTEMI  P:  Recommendations per Cardiology  Likely will need LHC at some point  Continue heparin gtt  Lasix 40 mg IV BID (home dose 40 mg BID) Monitor I/O's  RENAL A:   Acute on chronic kidney failure  P:   Trend BMP / urinary output Replace electrolytes as indicated, KCL 40 mEq x1 Avoid nephrotoxic agents, ensure adequate renal perfusion  GASTROINTESTINAL A:   Obesity P:   Diet as tolerated   HEMATOLOGIC A:   No acute issues P:  Trend CBC Heparin gtt per pharmacy  INFECTIOUS A:   HCAP  Sacral Decubitus Ulcer P:  ABX as above  Cefepime D3 at [facility] > D8 total D/c abx, monitor off  Had abx prior to transfer  WOC following for ulcer  Follow blood cultures to maturity   ENDOCRINE A:   DM2 P:   SSI Lantus 20 units QD (home dose 50 units)  NEUROLOGIC A:   Acute encephalopathy due to ativan> improving P:   Minimize sedating medications Supportive care PT efforts   FAMILY  - Updates: Daughters x2 updated on am NP rounds  PCCM will be available PRN.  Please call back if new needs arise.    Canary Brim, NP-C [place] [facility] Pgr: [phone] or if no answer [phone] 02/12/2017, 8:52 AM

## 2017-02-12 NOTE — Progress Notes (Signed)
ANTICOAGULATION CONSULT NOTE - Follow Up Consult  Pharmacy Consult for heparin Indication: nstemi  Allergies  Allergen Reactions  . Atorvastatin Other (See Comments)    Other reaction(s): Myalgias (intolerance)  . Penicillins Itching and Rash    Family members do not know the specifics  . Sulfur Itching and Rash    ITCHING & RASH    Patient Measurements: Height: 5\' 5"  (165.1 cm) Weight: 238 lb 8.6 oz (108.2 kg) IBW/kg (Calculated) : 57  Vital Signs: Temp: 98.4 F (36.9 C) (07/08 0803) Temp Source: Oral (07/08 0803) BP: 155/91 (07/08 0900) Pulse Rate: 106 (07/08 0900)  Labs:  Recent Labs  02/10/17 1814  02/11/17 0122 02/11/17 0447 02/11/17 0728 02/11/17 1642 02/12/17 0206  HGB  --   < > 12.5  --  11.8*  --  11.2*  HCT  --   --  38.5  --  36.3  --  35.3*  PLT  --   --  247  --  252  --  303  HEPARINUNFRC 0.47  --   --  0.25*  --  0.59 0.54  CREATININE 1.47*  --   --   --  1.43*  --  1.59*  TROPONINI 4.66*  --  7.88*  --  6.06*  --   --   < > = values in this interval not displayed.  Estimated Creatinine Clearance: 30.5 mL/min (A) (by C-G formula based on SCr of 1.59 mg/dL (H)).  Assessment: 83 [facility]   Patient is currently receiving IV heparin for nstemi. Heparin level is now at goal this morning. CBC is stable overnight and no bleeding complications have been noted.   Goal of Therapy:  Heparin level 0.3-0.7 units/ml Monitor platelets by anticoagulation protocol: Yes   Plan:  Continue heparin at 1250 units/hr Follow up plan for possible cath  Sheppard CoilFrank Teddy Rebstock PharmD., BCPS Clinical Pharmacist Pager [phone] 02/12/2017 10:38 AM

## 2017-02-12 NOTE — Progress Notes (Signed)
Progress Note  Patient Name: Meagan Roberts Date of Encounter: 02/12/2017  Primary Cardiologist:   New (Dr. Royann Shivers)  Subjective   She has some indigestion like abdominal pain.  No chest pain.  Breathing is OK  Inpatient Medications    Scheduled Meds: . amLODipine  10 mg Oral Daily  . aspirin EC  81 mg Oral Daily  . carvedilol  25 mg Oral Daily  . chlorhexidine  15 mL Mouth Rinse BID  . clopidogrel  75 mg Oral Daily  . colchicine  0.6 mg Oral Daily  . feeding supplement (ENSURE ENLIVE)  237 mL Oral BID BM  . feeding supplement (PRO-STAT SUGAR FREE 64)  30 mL Oral BID  . furosemide  40 mg Intravenous BID  . gabapentin  300 mg Oral QHS  . hydrALAZINE  10 mg Oral Q8H  . insulin aspart  0-20 Units Subcutaneous TID WC  . insulin aspart  0-5 Units Subcutaneous QHS  . insulin glargine  20 Units Subcutaneous Daily  . irbesartan  300 mg Oral Daily  . mouth rinse  15 mL Mouth Rinse q12n4p  . pantoprazole  40 mg Oral BID  . potassium chloride  40 mEq Oral Once  . prednisoLONE acetate  1 drop Right Eye BID   Continuous Infusions: . ceFEPime (MAXIPIME) IV Stopped (02/12/17 0830)  . heparin 1,250 Units/hr (02/12/17 0800)   PRN Meds: acetaminophen, nitroGLYCERIN, ondansetron (ZOFRAN) IV   Vital Signs    Vitals:   02/12/17 0800 02/12/17 0803 02/12/17 0815 02/12/17 0850  BP: (!) 156/88   (!) 156/88  Pulse: 94  96 (!) 102  Resp: (!) 24  20 (!) 25  Temp:  98.4 F (36.9 C)    TempSrc:  Oral    SpO2: 100%  98% 96%  Weight:      Height:        Intake/Output Summary (Last 24 hours) at 02/12/17 0912 Last data filed at 02/12/17 0800  Gross per 24 hour  Intake           1297.5 ml  Output             2620 ml  Net          -1322.5 ml   Filed Weights   02/11/17 0500 02/12/17 0304  Weight: 247 lb 5.7 oz (112.2 kg) 238 lb 8.6 oz (108.2 kg)    Telemetry    Sinus tach - Personally Reviewed  ECG    NA - Personally Reviewed  Physical Exam   GEN: Uncomfortable but not in  distress Neck: No  JVD Cardiac:  RRR, no murmurs, rubs, or gallops.  Respiratory: Decreased breath sounds GI: Soft, nontender, non-distended, normal bowel sounds  MS:  Trace edema; No deformity. Neuro:   Nonfocal  Psych: Oriented and appropriate    Labs    Chemistry  Recent Labs Lab 02/10/17 1814 02/11/17 0728 02/12/17 0206  NA 137 140 136  K 4.3 4.1 3.7  CL 102 100* 95*  CO2 28 30 33*  GLUCOSE 291* 233* 241*  BUN 21* 21* 21*  CREATININE 1.47* 1.43* 1.59*  CALCIUM 9.0 9.1 9.1  GFRNONAA 31* 32* 28*  GFRAA 36* 37* 33*  ANIONGAP 7 10 8      Hematology  Recent Labs Lab 02/11/17 0122 02/11/17 0728 02/12/17 0206  WBC 13.9* 13.2* 13.8*  RBC 4.86 4.61 4.47  HGB 12.5 11.8* 11.2*  HCT 38.5 36.3 35.3*  MCV 79.2 78.7 79.0  MCH 25.7* 25.6* 25.1*  MCHC 32.5 32.5 31.7  RDW 16.6* 16.6* 16.6*  PLT 247 252 303    Cardiac Enzymes  Recent Labs Lab 02/10/17 1814 02/11/17 0122 02/11/17 0728  TROPONINI 4.66* 7.88* 6.06*   No results for input(s): TROPIPOC in the last 168 hours.   BNP  Recent Labs Lab 02/10/17 1814  BNP 331.9*     DDimer No results for input(s): DDIMER in the last 168 hours.   Radiology    Dg Chest Port 1 View  Result Date: 02/12/2017 CLINICAL DATA:  Acute respiratory failure EXAM: PORTABLE CHEST 1 VIEW COMPARISON:  Two days ago FINDINGS: Cardiopericardial enlargement, similar to prior when accounting for rotation. There is diffuse interstitial opacity. Haziness of the chest attributed to layering pleural effusions. No pneumothorax. IMPRESSION: CHF. Increased hazy density of the chest suggests layering bilateral effusions. Electronically Signed   By: Marnee SpringJonathon  Watts M.D.   On: 02/12/2017 07:19   Dg Chest Port 1 View  Result Date: 02/10/2017 CLINICAL DATA:  Dyspnea. EXAM: PORTABLE CHEST 1 VIEW COMPARISON:  02/10/2017 at 0814 hours FINDINGS: Patient rotated minimally right. Midline trachea. Cardiomegaly accentuated by AP portable technique. Small  layering bilateral pleural effusions. No pneumothorax. Moderate interstitial edema is not significantly changed. Bibasilar airspace disease is similar, given differences in technique. Mildly degraded exam due to AP portable technique and patient body habitus. IMPRESSION: No significant change since earlier today. Congestive heart failure with layering bilateral pleural effusions and bibasilar airspace disease. Electronically Signed   By: Jeronimo GreavesKyle  Talbot M.D.   On: 02/10/2017 16:37    Cardiac Studies   NA  Patient Profile     83 y.o. female with a history of CAD status post remote stenting, stroke, diabetes, hypertension, hyperlipidemia, and depression who [facility].  Assessment & Plan    ACUTE DYSPNEA:   CXR with laying effusions.  She is down 3.7 liters since admission.  Creat up slightly .  Continue to diurese with current dose Lasix.    NSTEMI:   Not clear that this was an acute cardiac event vs demand ischemia.  Continue heparin. .  Echo at outside with  NL LV function.   Likely cath before discharge if all else stabilizes.    CKD stage III:   Creat is up slightly.   Follow.    HTN:  BP is  not controlled.  I will increase hydralazine.  Continue other meds as listed.   Signed, Rollene RotundaJames Paislee Szatkowski, MD  02/12/2017, 9:12 AM

## 2017-02-12 NOTE — Progress Notes (Signed)
eLink Physician-Brief Progress Note Patient Name: Meagan Roberts DOB: 01/25/1927 MRN: [id number]   Date of Service  02/12/2017  HPI/Events of Note  Agitation/Anxiety - reluctant to give benzodiazepines. Admitted with heart failure.   eICU Interventions  Will order: 1. Lasix 40 mg IV now (extra dose).  3. Xanax 0.25 mg PO now.  3. Would try back on BiPAP.      Intervention Category Minor Interventions: Agitation / anxiety - evaluation and management  Halah Whiteside Eugene 02/12/2017, 9:01 PM

## 2017-02-13 ENCOUNTER — Inpatient Hospital Stay (HOSPITAL_COMMUNITY): Payer: Medicare HMO

## 2017-02-13 DIAGNOSIS — I214 Non-ST elevation (NSTEMI) myocardial infarction: Secondary | ICD-10-CM

## 2017-02-13 DIAGNOSIS — I25110 Atherosclerotic heart disease of native coronary artery with unstable angina pectoris: Secondary | ICD-10-CM

## 2017-02-13 LAB — GLUCOSE, CAPILLARY
GLUCOSE-CAPILLARY: 239 mg/dL — AB (ref 65–99)
Glucose-Capillary: 200 mg/dL — ABNORMAL HIGH (ref 65–99)
Glucose-Capillary: 170 mg/dL — ABNORMAL HIGH (ref 65–99)

## 2017-02-13 LAB — BASIC METABOLIC PANEL
Anion gap: 6 (ref 5–15)
BUN: 20 mg/dL (ref 6–20)
CO2: 36 mmol/L — ABNORMAL HIGH (ref 22–32)
Calcium: 8.8 mg/dL — ABNORMAL LOW (ref 8.9–10.3)
Chloride: 92 mmol/L — ABNORMAL LOW (ref 101–111)
Creatinine, Ser: 1.65 mg/dL — ABNORMAL HIGH (ref 0.44–1.00)
GFR calc Af Amer: 31 mL/min — ABNORMAL LOW (ref >60)
GFR, EST NON AFRICAN AMERICAN: 27 mL/min — AB (ref >60)
GLUCOSE: 291 mg/dL — AB (ref 65–99)
POTASSIUM: 3.4 mmol/L — AB (ref 3.5–5.1)
Sodium: 134 mmol/L — ABNORMAL LOW (ref 135–145)

## 2017-02-13 LAB — CBC
HCT: 36.6 % (ref 36.0–46.0)
Hemoglobin: 11.6 g/dL — ABNORMAL LOW (ref 12.0–15.0)
MCH: 24.9 pg — AB (ref 26.0–34.0)
MCHC: 31.7 g/dL (ref 30.0–36.0)
MCV: 78.5 fL (ref 78.0–100.0)
PLATELETS: 252 10*3/uL (ref 150–400)
RBC: 4.66 MIL/uL (ref 3.87–5.11)
RDW: 16.3 % — AB (ref 11.5–15.5)
WBC: 12.8 10*3/uL — ABNORMAL HIGH (ref 4.0–10.5)

## 2017-02-13 LAB — ECHOCARDIOGRAM COMPLETE
Height: 65 in
WEIGHTICAEL: 3784.86 [oz_av]

## 2017-02-13 LAB — BRAIN NATRIURETIC PEPTIDE: B Natriuretic Peptide: 219.7 pg/mL — ABNORMAL HIGH (ref 0.0–100.0)

## 2017-02-13 LAB — HEPARIN LEVEL (UNFRACTIONATED): Heparin Unfractionated: 0.48 IU/mL (ref 0.30–0.70)

## 2017-02-13 MED ORDER — SODIUM CHLORIDE 0.9 % WEIGHT BASED INFUSION: 3.0000 mL/kg/h | INTRAVENOUS | Status: AC

## 2017-02-13 MED ORDER — SODIUM CHLORIDE 0.9 % WEIGHT BASED INFUSION: 1.0000 mL/kg/h | INTRAVENOUS | Status: DC

## 2017-02-13 MED ORDER — ASPIRIN 81 MG PO CHEW
81.0000 mg | CHEWABLE_TABLET | ORAL | Status: AC
  Administered 2017-02-14: 81 mg via ORAL
  Filled 2017-02-13: qty 1

## 2017-02-13 MED ORDER — ALPRAZOLAM 0.25 MG PO TABS
0.2500 mg | ORAL_TABLET | Freq: Every evening | ORAL | Status: AC | PRN
  Administered 2017-02-13 – 2017-02-14 (×2): 0.25 mg via ORAL
  Filled 2017-02-13 (×3): qty 1

## 2017-02-13 MED ORDER — SODIUM CHLORIDE 0.9 % IV SOLN: 250.0000 mL | INTRAVENOUS | Status: DC | PRN

## 2017-02-13 MED ORDER — POTASSIUM CHLORIDE CRYS ER 20 MEQ PO TBCR
20.0000 meq | EXTENDED_RELEASE_TABLET | Freq: Once | ORAL | Status: AC
Start: 2017-02-13 — End: 2017-02-13
  Administered 2017-02-13: 20 meq via ORAL
  Filled 2017-02-13: qty 1

## 2017-02-13 MED ORDER — SODIUM CHLORIDE 0.9% FLUSH: 3.0000 mL | INTRAVENOUS | Status: DC | PRN

## 2017-02-13 MED ORDER — SODIUM CHLORIDE 0.9 % WEIGHT BASED INFUSION: 3.0000 mL/kg/h | INTRAVENOUS | Status: DC

## 2017-02-13 MED ORDER — SODIUM CHLORIDE 0.9% FLUSH
3.0000 mL | Freq: Two times a day (BID) | INTRAVENOUS | Status: DC
  Administered 2017-02-13 – 2017-02-15 (×4): 3 mL via INTRAVENOUS

## 2017-02-13 NOTE — Progress Notes (Addendum)
Progress Note  Patient Name: Meagan Roberts Date of Encounter: 02/13/2017  Primary Cardiologist: Dr. Royann Shiversroitoru  Subjective   No complaints this am.  Denies any CP  Inpatient Medications    Scheduled Meds: . amLODipine  10 mg Oral Daily  . aspirin EC  81 mg Oral Daily  . carvedilol  25 mg Oral Daily  . chlorhexidine  15 mL Mouth Rinse BID  . clopidogrel  75 mg Oral Daily  . colchicine  0.6 mg Oral Daily  . feeding supplement (ENSURE ENLIVE)  237 mL Oral BID BM  . feeding supplement (PRO-STAT SUGAR FREE 64)  30 mL Oral BID  . furosemide  40 mg Intravenous BID  . gabapentin  300 mg Oral QHS  . hydrALAZINE  25 mg Oral Q6H  . insulin aspart  0-20 Units Subcutaneous TID WC  . insulin aspart  0-5 Units Subcutaneous QHS  . insulin glargine  20 Units Subcutaneous Daily  . irbesartan  300 mg Oral Daily  . mouth rinse  15 mL Mouth Rinse q12n4p  . pantoprazole  40 mg Oral BID  . prednisoLONE acetate  1 drop Right Eye BID   Continuous Infusions: . heparin 1,250 Units/hr (02/13/17 0600)   PRN Meds: acetaminophen, alum & mag hydroxide-simeth, nitroGLYCERIN, ondansetron (ZOFRAN) IV   Vital Signs    Vitals:   02/13/17 0400 02/13/17 0500 02/13/17 0600 02/13/17 0745  BP: 139/76 128/69 134/69 (!) 152/79  Pulse: 83 73 71 86  Resp: 15 (!) 23 20 18   Temp:      TempSrc:      SpO2: 98% 97% 97% 98%  Weight:   236 lb 8.9 oz (107.3 kg)   Height:   5\' 5"  (1.651 m)     Intake/Output Summary (Last 24 hours) at 02/13/17 0801 Last data filed at 02/13/17 0600  Gross per 24 hour  Intake              875 ml  Output             2050 ml  Net            -1175 ml   Filed Weights   02/12/17 0304 02/13/17 0321 02/13/17 0600  Weight: 238 lb 8.6 oz (108.2 kg) 236 lb 8.9 oz (107.3 kg) 236 lb 8.9 oz (107.3 kg)    Telemetry    NSR - Personally Reviewed  ECG    NSR with LBBB- Personally Reviewed  Physical Exam   GEN: No acute distress.   Neck: No JVD Cardiac: RRR, no murmurs, rubs, or  gallops.  Respiratory: crackles at bases GI: Soft, nontender, non-distended  MS: No edema; No deformity. Neuro:  Nonfocal  Psych: Normal affect   Labs    Chemistry Recent Labs Lab 02/11/17 0728 02/12/17 0206 02/13/17 0221  NA 140 136 134*  K 4.1 3.7 3.4*  CL 100* 95* 92*  CO2 30 33* 36*  GLUCOSE 233* 241* 291*  BUN 21* 21* 20  CREATININE 1.43* 1.59* 1.65*  CALCIUM 9.1 9.1 8.8*  GFRNONAA 32* 28* 27*  GFRAA 37* 33* 31*  ANIONGAP 10 8 6      Hematology Recent Labs Lab 02/11/17 0728 02/12/17 0206 02/13/17 0221  WBC 13.2* 13.8* 12.8*  RBC 4.61 4.47 4.66  HGB 11.8* 11.2* 11.6*  HCT 36.3 35.3* 36.6  MCV 78.7 79.0 78.5  MCH 25.6* 25.1* 24.9*  MCHC 32.5 31.7 31.7  RDW 16.6* 16.6* 16.3*  PLT 252 303 252    Cardiac  Enzymes Recent Labs Lab 02/10/17 1814 02/11/17 0122 02/11/17 0728  TROPONINI 4.66* 7.88* 6.06*   No results for input(s): TROPIPOC in the last 168 hours.   BNP Recent Labs Lab 02/10/17 1814  BNP 331.9*     DDimer No results for input(s): DDIMER in the last 168 hours.   Radiology    Dg Chest Port 1 View  Result Date: 02/13/2017 CLINICAL DATA:  Acute respiratory failure with hypoxia.  Dyspnea. EXAM: PORTABLE CHEST 1 VIEW COMPARISON:  Chest radiograph from one day prior. FINDINGS: Stable cardiomediastinal silhouette with cardiomegaly. No pneumothorax. Stable small to moderate bilateral pleural effusions. Mild-to-moderate pulmonary edema, slightly improved. Hazy bibasilar lung opacities, stable. IMPRESSION: 1. Mild-to-moderate congestive heart failure, slightly improved . 2. Stable small to moderate bilateral pleural effusions. 3. Stable hazy bibasilar lung opacities, favor atelectasis. Electronically Signed   By: Delbert Phenix M.D.   On: 02/13/2017 07:47   Dg Chest Port 1 View  Result Date: 02/12/2017 CLINICAL DATA:  Acute respiratory failure EXAM: PORTABLE CHEST 1 VIEW COMPARISON:  Two days ago FINDINGS: Cardiopericardial enlargement, similar to prior  when accounting for rotation. There is diffuse interstitial opacity. Haziness of the chest attributed to layering pleural effusions. No pneumothorax. IMPRESSION: CHF. Increased hazy density of the chest suggests layering bilateral effusions. Electronically Signed   By: Marnee Spring M.D.   On: 02/12/2017 07:19    Cardiac Studies   none  Patient Profile     83 y.o. female with a history of CAD status post remote stenting, stroke, diabetes, hypertension, hyperlipidemia, and depression who transferred from [facility] for chest pain.   Assessment & Plan    1.  SOB - CXR with pleural effusions.  She is neg 2.2L from yesterday and net neg 5L.  Creatinine bumped slightly at 1.65 (1.47 on admit).  CO2 up to 36. Cxray still with CHF but improving and CXRAY clearing can lag in clearing with clinical improvement.  She has a few crackles at bases which could be atelectasis.  Will check BNP.  Will hold Lasix today.  Repeat BMET in am.  2.  NSTEMI - peaked at 7.88 (4.66>>7.88>>6.06).  Trop higher than expected for demand ischemia and has new LBBB.  Will plan for cath tomorrow if creatinine stable with holding diuretics.  Repeat 2D echo pending for LVF (EF on 02/02/17 at [facility] showed EF 50-55%).  Marland Kitchen  Continue ASA, Plavix, IV heparin,  BB and statin.   3.  CKD stage III - creatinine continues to climb with diuresis so will hold lasix today and repeat BMET in am.  4.  HTN - Bp borderline controlled.  Continue Hydralazine, amlodipine, ARB and carvedilol.    5.  ASCAD with remote PCI - continue ASA. BB and Plavix.  Not on statin due to intolerance.  6.  DM with elevated BS - check HbA1C.  Will ask TRH to help.  6.  History of respiratory failure with PNA - treated recently at [facility] with IV antbx.  CBC still elevated ? Etiology.  Will ask TRH to assume primary for noncardiac problems and we will follow for cardiac issues.  I have spent a total of 35 minutes with patient reviewing hospital notes  , telemetry, EKGs, labs and examining patient as well as establishing an assessment and plan that was discussed with the patient.  > 50% of time was spent in direct patient care.    Signed, [name], MD  02/13/2017, 8:01 AM

## 2017-02-13 NOTE — Consult Note (Addendum)
WOC Nurse wound consult note Reason for Consult: Consult requested for sacrum.  Daughter at the bedside to assess wound appearance;  states it occurred when pt was at  hospital. [facility] Pressure Injury POA: Yes Measurement: 1.5X.3X.2cm Wound bed: red and moist Drainage (amount, consistency, odor) small amt pink drainage, no odor Periwound: intact skin surrounding Dressing procedure/placement/frequency: Pt has a foam dressing in place to protect from further injury and promote healing, and a low air-loss bed to reduce pressure.  Discussed plan of care with patient and daughter. Please re-consult if further assistance is needed.  Thank-you,  Cammie Mcgeeawn Twylla Arceneaux MSN, RN, CWOCN, [place]AP, CNS [phone]

## 2017-02-13 NOTE — Progress Notes (Signed)
Pt refuse NIV for the night,  States that machine is too noisy and uncomfortable. Pt placed back on [place]. RN aware. RN at bedside with RRT

## 2017-02-13 NOTE — Progress Notes (Signed)
After 10 minutes of continuous monitoring, patient now has decreased work of breathing. Respiratory rate remains elevated, though decreased since onset of respiratory distress. Patient states she feels comfortable at this time. Will continue bipap for now until back to baseline.

## 2017-02-13 NOTE — Progress Notes (Signed)
Received return call from Micah FlesherAngela Duke. Stated she will come as soon as possible to discuss cath procedure with patient.

## 2017-02-13 NOTE — Progress Notes (Signed)
Patient had episode of acute respiratory distress while in right supine position while doing patient care. Placed patient on bipap and monitored continuously for effectiveness. Patient dyspneic and tachypneic at this time.

## 2017-02-13 NOTE — Progress Notes (Signed)
  Echocardiogram 2D Echocardiogram has been performed.  Nikol Lemar 02/13/2017, 9:33 AM

## 2017-02-13 NOTE — Progress Notes (Signed)
Inpatient Diabetes Program Recommendations  AACE/ADA: New Consensus Statement on Inpatient Glycemic Control (2015)  Target Ranges:  Prepandial:   less than 140 mg/dL      Peak postprandial:   less than 180 mg/dL (1-2 hours)      Critically ill patients:  140 - 180 mg/dL   Lab Results  Component Value Date   GLUCAP 200 (H) 02/13/2017    Review of Glycemic Control Results for Meagan HartNGRAM, Aldene F (MRN [id number]) as of 02/13/2017 09:55  Ref. Range 02/12/2017 07:57 02/12/2017 11:54 02/12/2017 16:40 02/12/2017 21:09 02/13/2017 08:29  Glucose-Capillary Latest Ref Range: 65 - 99 mg/dL 621243 (H) 308391 (H) 657392 (H) 348 (H) 200 (H)   Diabetes history: DM2 Outpatient Diabetes medications: Lantus 50 units + Novolog meal coverage 15 units-15 units-20 units ac meals Current orders for Inpatient glycemic control: Lantus 20 units + Novolog correction 0-20 units tid + 0-5 units hs  Inpatient Diabetes Program Recommendations:   Noted hyperglycemia. Please consider: -Increase Lantus to 30 units qd -Add Novolog 5 units meal coverage tid if eats 50% meal or supplement  Thank you, Darel HongJudy E. Abdiel Blackerby, RN, MSN, CDE  Diabetes Coordinator Inpatient Glycemic Control Team Team Pager [phone] (8am-5pm) 02/13/2017 9:57 AM

## 2017-02-13 NOTE — Progress Notes (Signed)
Pt is on NIV at this time 

## 2017-02-13 NOTE — Care Management Note (Addendum)
Case Management Note Donn PieriniKristi Tarance Balan RN, BSN Unit 2W-Case Manager-- 2H coverage [phone]  Patient Details  Name: Meagan Roberts Nurse MRN: [id number] Date of Birth: 08/25/1926  Subjective/Objective:    Pt admitted with NSTEMI, currently on BIPAP-, awaiting for cath                Action/Plan: PTA pt lived at home with family- CM to follow for d/c needs Update-1245 [facility]- [facility]- [facility] has Berkley Harveyauth- but will need to be reassess here for SNF and submitted for re-auth if SNF still needed- will consult CSW.   Expected Discharge Date:                  Expected Discharge Plan:  Skilled Nursing Facility  In-House Referral:   Social Work  Discharge planning Services  CM Consult  Post Acute Care Choice:    Choice offered to:     DME Arranged:    DME Agency:     HH Arranged:    HH Agency:     Status of Service:  In process, will continue to follow  If discussed at Long Length of Stay Meetings, dates discussed:    Discharge Disposition:   Additional Comments:  Meagan Roberts, Meagan Encina Hall, RN 02/13/2017, 10:45 AM

## 2017-02-13 NOTE — Progress Notes (Signed)
Patient requested to talk to doctor prior to signing consent. Meagan FlesherAngela Duke, PA, paged.

## 2017-02-13 NOTE — Progress Notes (Signed)
ANTICOAGULATION CONSULT NOTE - Follow Up Consult  Pharmacy Consult for heparin Indication: nstemi  Allergies  Allergen Reactions  . Atorvastatin Other (See Comments)    Other reaction(s): Myalgias (intolerance)  . Penicillins Itching and Rash    Family members do not know the specifics  . Sulfur Itching and Rash    ITCHING & RASH    Patient Measurements: Height: 5\' 5"  (165.1 cm) Weight: 236 lb 8.9 oz (107.3 kg) IBW/kg (Calculated) : 57  Vital Signs: Temp: 98.9 F (37.2 C) (07/09 0800) Temp Source: Oral (07/09 0800) BP: 144/109 (07/09 1000) Pulse Rate: 107 (07/09 1030)  Labs:  Recent Labs  02/10/17 1814  02/11/17 0122  02/11/17 0728 02/11/17 1642 02/12/17 0206 02/13/17 0221  HGB  --   < > 12.5  --  11.8*  --  11.2* 11.6*  HCT  --   < > 38.5  --  36.3  --  35.3* 36.6  PLT  --   < > 247  --  252  --  303 252  HEPARINUNFRC 0.47  --   --   < >  --  0.59 0.54 0.48  CREATININE 1.47*  --   --   --  1.43*  --  1.59* 1.65*  TROPONINI 4.66*  --  7.88*  --  6.06*  --   --   --   < > = values in this interval not displayed.  Estimated Creatinine Clearance: 29.2 mL/min (A) (by C-G formula based on SCr of 1.65 mg/dL (H)).  Assessment: 83 [facility] Patient is currently receiving IV heparin for nstemi. Heparin level is at goal this morning. Plans noted for cath.   Goal of Therapy:  Heparin level 0.3-0.7 units/ml Monitor platelets by anticoagulation protocol: Yes   Plan:  Continue heparin at 1250 units/hr Will follow plans post cath  Harland GermanAndrew Dalaney Needle, Pharm D 02/13/2017 11:03 AM

## 2017-02-13 NOTE — Progress Notes (Signed)
Patient is scheduled for left heart catheterization tomorrow at 12:00pm noon with Dr. Eldridge DaceVaranasi. NPO at MN.  BMP in the morning.    Marcelino Dusterngela Nicole Jaylean Buenaventura, PA-C 02/13/2017, 12:59 PM [phone] [facility]

## 2017-02-14 ENCOUNTER — Encounter (HOSPITAL_COMMUNITY)
Admission: AD | Disposition: A | Discharge status: Skilled Nursing Facility | Payer: Self-pay | Source: Other Acute Inpatient Hospital | Attending: Cardiovascular Disease

## 2017-02-14 DIAGNOSIS — IMO0001 Reserved for inherently not codable concepts without codable children: Secondary | ICD-10-CM

## 2017-02-14 DIAGNOSIS — J9621 Acute and chronic respiratory failure with hypoxia: Secondary | ICD-10-CM

## 2017-02-14 DIAGNOSIS — Z794 Long term (current) use of insulin: Secondary | ICD-10-CM

## 2017-02-14 DIAGNOSIS — E118 Type 2 diabetes mellitus with unspecified complications: Secondary | ICD-10-CM

## 2017-02-14 DIAGNOSIS — L89302 Pressure ulcer of unspecified buttock, stage 2: Secondary | ICD-10-CM

## 2017-02-14 DIAGNOSIS — I10 Essential (primary) hypertension: Secondary | ICD-10-CM

## 2017-02-14 DIAGNOSIS — I5041 Acute combined systolic (congestive) and diastolic (congestive) heart failure: Secondary | ICD-10-CM

## 2017-02-14 LAB — BASIC METABOLIC PANEL
Anion gap: 8 (ref 5–15)
BUN: 20 mg/dL (ref 6–20)
CALCIUM: 8.9 mg/dL (ref 8.9–10.3)
CHLORIDE: 92 mmol/L — AB (ref 101–111)
CO2: 36 mmol/L — AB (ref 22–32)
CREATININE: 1.56 mg/dL — AB (ref 0.44–1.00)
GFR calc non Af Amer: 29 mL/min — ABNORMAL LOW (ref >60)
GFR, EST AFRICAN AMERICAN: 33 mL/min — AB (ref >60)
Glucose, Bld: 97 mg/dL (ref 65–99)
Potassium: 3.8 mmol/L (ref 3.5–5.1)
Sodium: 136 mmol/L (ref 135–145)

## 2017-02-14 LAB — CBC
HCT: 37.2 % (ref 36.0–46.0)
Hemoglobin: 11.9 g/dL — ABNORMAL LOW (ref 12.0–15.0)
MCH: 25.4 pg — AB (ref 26.0–34.0)
MCHC: 32 g/dL (ref 30.0–36.0)
MCV: 79.3 fL (ref 78.0–100.0)
Platelets: 281 10*3/uL (ref 150–400)
RBC: 4.69 MIL/uL (ref 3.87–5.11)
RDW: 16.8 % — AB (ref 11.5–15.5)
WBC: 14.5 10*3/uL — ABNORMAL HIGH (ref 4.0–10.5)

## 2017-02-14 LAB — PROCALCITONIN: Procalcitonin: 0.2 ng/mL

## 2017-02-14 LAB — PROTIME-INR
INR: 1.17
PROTHROMBIN TIME: 15 s (ref 11.4–15.2)

## 2017-02-14 LAB — URINALYSIS, ROUTINE W REFLEX MICROSCOPIC
BILIRUBIN URINE: NEGATIVE
GLUCOSE, UA: NEGATIVE mg/dL
Ketones, ur: NEGATIVE mg/dL
NITRITE: NEGATIVE
Protein, ur: 30 mg/dL — AB
SPECIFIC GRAVITY, URINE: 1.003 — AB (ref 1.005–1.030)
pH: 6 (ref 5.0–8.0)

## 2017-02-14 LAB — GLUCOSE, CAPILLARY
GLUCOSE-CAPILLARY: 193 mg/dL — AB (ref 65–99)
Glucose-Capillary: 126 mg/dL — ABNORMAL HIGH (ref 65–99)
Glucose-Capillary: 240 mg/dL — ABNORMAL HIGH (ref 65–99)
Glucose-Capillary: 158 mg/dL — ABNORMAL HIGH (ref 65–99)

## 2017-02-14 LAB — HEPARIN LEVEL (UNFRACTIONATED): HEPARIN UNFRACTIONATED: 0.38 [IU]/mL (ref 0.30–0.70)

## 2017-02-14 SURGERY — LEFT HEART CATH AND CORONARY ANGIOGRAPHY
Anesthesia: LOCAL

## 2017-02-14 MED ORDER — FUROSEMIDE 10 MG/ML IJ SOLN
60.0000 mg | Freq: Two times a day (BID) | INTRAMUSCULAR | Status: DC
  Administered 2017-02-14 – 2017-02-17 (×5): 60 mg via INTRAVENOUS
  Filled 2017-02-14 (×5): qty 6

## 2017-02-14 NOTE — Consult Note (Addendum)
Medical Consultation   Meagan Roberts  EAV:[id number]  DOB: [birth year]  DOA: 02/10/2017  PCP: Olive Bass, MD   Outpatient Specialists: None   Requesting physician: Dr. [name] - Cardiology  Reason for consultation: General medial assistance   History of Present Illness: Meagan Roberts is an 83 y.o. female with a past medical history of asthma, CHF, C KD, diabetes, hypertension, MI and stroke. Please patient presented to [facility] after initial treatment at [facility] were patient was admitted from 02/02/2017 until 02/10/2017 for acute hypoxic respiratory failure and sepsis secondary to pneumonia. During her stay at [facility] she was noted to have an elevation in her troponin to 0.99 and a new left bundle branch block. Cardiology was consult and patient was transferred to [facility] for further treatment and evaluation by cardiology team. At time of arrival patient was placed in the ICU and was on BiPAP. CCM was consulted after arriving at [facility].   Currently patient is without any active complaints other than wanting to be out of the hospital. She is maintaining her O2 saturations on 5 L nasal cannula. Typically at home she uses O2 when necessary (typically with exertion or at night.). Patient denies any active chest pain, palpitations, general malaise, fevers, abdominal pain, dysuria, frequency, diarrhea, headache.   Review of Systems:  ROS As per HPI otherwise all other systems reviewed and are negative   Past Medical History: Past Medical History:  Diagnosis Date  . Arthritis   . Asthma   . CHF (congestive heart failure) (HCC)   . CKD (chronic kidney disease), stage III   . Depression   . Diabetes mellitus without complication (HCC)   . Hypertension   . MI (myocardial infarction) (HCC)   . Pneumonia 02/2017  . Stroke [facility])     Past Surgical History: Past Surgical History:  Procedure Laterality Date  . ABDOMINAL  HYSTERECTOMY    . CATARACT EXTRACTION       Allergies:   Allergies  Allergen Reactions  . Atorvastatin Other (See Comments)    Other reaction(s): Myalgias (intolerance)  . Penicillins Itching and Rash    Family members do not know the specifics  . Sulfur Itching and Rash    ITCHING & RASH     Social History:  reports that she has never smoked. She has never used smokeless tobacco. She reports that she does not drink alcohol or use drugs.   Family History: Family History  Problem Relation Age of Onset  . Hypertension Mother   . Hypertension Father      Physical Exam: Vitals:   02/14/17 0800 02/14/17 0900 02/14/17 1200 02/14/17 1300  BP: 139/81 (!) 141/77 (!) 152/83 134/81  Pulse: 84 84 97 100  Resp: 16 20 (!) 23 20  Temp: 98.1 F (36.7 C)     TempSrc: Oral     SpO2: 97% 97% (!) 89% 96%  Weight:      Height:        General:  Appears calm and comfortable Eyes:  Right eye blind, EOMI, normal lids, iris ENT:  grossly normal hearing, lips & tongue, mmm Neck:  no LAD, masses or thyromegaly Cardiovascular:  RRR, no m/r/g. Trace-1+ LE edema.  Respiratory: Crackles noted in bases bilaterally without any wheezing or rhonchi. Good air movement. On 5 L nasal cannula Abdomen:  soft, ntnd, NABS Skin:  Vertical  ulcer/tear at the apex of the gluteal cleft w/ pink beefy tissue w/o discharge or foul smell Musculoskeletal:  grossly normal tone BUE/BLE, good ROM, no bony abnormality Psychiatric:  grossly normal mood and affect, speech fluent and appropriate, AOx3 Neurologic:  CN 2-12 grossly intact, moves all extremities in coordinated fashion, sensation intact  Data reviewed:  I have personally reviewed following labs and imaging studies Labs:  CBC:  Recent Labs Lab 02/11/17 0122 02/11/17 0728 02/12/17 0206 02/13/17 0221 02/14/17 0244  WBC 13.9* 13.2* 13.8* 12.8* 14.5*  HGB 12.5 11.8* 11.2* 11.6* 11.9*  HCT 38.5 36.3 35.3* 36.6 37.2  MCV 79.2 78.7 79.0 78.5 79.3    PLT 247 252 303 252 281    Basic Metabolic Panel:  Recent Labs Lab 02/10/17 1814 02/11/17 0728 02/12/17 0206 02/13/17 0221 02/14/17 0244  NA 137 140 136 134* 136  K 4.3 4.1 3.7 3.4* 3.8  CL 102 100* 95* 92* 92*  CO2 28 30 33* 36* 36*  GLUCOSE 291* 233* 241* 291* 97  BUN 21* 21* 21* 20 20  CREATININE 1.47* 1.43* 1.59* 1.65* 1.56*  CALCIUM 9.0 9.1 9.1 8.8* 8.9   GFR Estimated Creatinine Clearance: 30.8 mL/min (A) (by C-G formula based on SCr of 1.56 mg/dL (H)). Liver Function Tests: No results for input(s): AST, ALT, ALKPHOS, BILITOT, PROT, ALBUMIN in the last 168 hours. No results for input(s): LIPASE, AMYLASE in the last 168 hours. No results for input(s): AMMONIA in the last 168 hours. Coagulation profile  Recent Labs Lab 02/14/17 0244  INR 1.17    Cardiac Enzymes:  Recent Labs Lab 02/10/17 1814 02/11/17 0122 02/11/17 0728  TROPONINI 4.66* 7.88* 6.06*   BNP: Invalid input(s): POCBNP CBG:  Recent Labs Lab 02/13/17 0829 02/13/17 1144 02/13/17 1701 02/14/17 0822 02/14/17 1214  GLUCAP 200* 239* 170* 126* 193*   D-Dimer No results for input(s): DDIMER in the last 72 hours. Hgb A1c No results for input(s): HGBA1C in the last 72 hours. Lipid Profile No results for input(s): CHOL, HDL, LDLCALC, TRIG, CHOLHDL, LDLDIRECT in the last 72 hours. Thyroid function studies No results for input(s): TSH, T4TOTAL, T3FREE, THYROIDAB in the last 72 hours.  Invalid input(s): FREET3 Anemia work up No results for input(s): VITAMINB12, FOLATE, FERRITIN, TIBC, IRON, RETICCTPCT in the last 72 hours. Urinalysis No results found for: COLORURINE, APPEARANCEUR, LABSPEC, PHURINE, GLUCOSEU, HGBUR, BILIRUBINUR, KETONESUR, PROTEINUR, UROBILINOGEN, NITRITE, LEUKOCYTESUR   Microbiology Recent Results (from the past 240 hour(s))  MRSA PCR Screening     Status: None   Collection Time: 02/10/17  2:39 PM  Result Value Ref Range Status   MRSA by PCR NEGATIVE NEGATIVE Final     Comment:        The GeneXpert MRSA Assay (FDA approved for NASAL specimens only), is one component of a comprehensive MRSA colonization surveillance program. It is not intended to diagnose MRSA infection nor to guide or monitor treatment for MRSA infections.   Culture, blood (Routine X 2) w Reflex to ID Panel     Status: None (Preliminary result)   Collection Time: 02/10/17  4:53 PM  Result Value Ref Range Status   Specimen Description BLOOD RIGHT HAND  Final   Special Requests IN PEDIATRIC BOTTLE Blood Culture adequate volume  Final   Culture NO GROWTH 4 DAYS  Final   Report Status PENDING  Incomplete  Culture, blood (Routine X 2) w Reflex to ID Panel     Status: None (Preliminary result)   Collection Time: 02/10/17  4:58  PM  Result Value Ref Range Status   Specimen Description BLOOD LEFT ANTECUBITAL  Final   Special Requests IN PEDIATRIC BOTTLE Blood Culture adequate volume  Final   Culture NO GROWTH 4 DAYS  Final   Report Status PENDING  Incomplete       Inpatient Medications:   Scheduled Meds: . amLODipine  10 mg Oral Daily  . aspirin EC  81 mg Oral Daily  . carvedilol  25 mg Oral Daily  . chlorhexidine  15 mL Mouth Rinse BID  . clopidogrel  75 mg Oral Daily  . colchicine  0.6 mg Oral Daily  . feeding supplement (ENSURE ENLIVE)  237 mL Oral BID BM  . feeding supplement (PRO-STAT SUGAR FREE 64)  30 mL Oral BID  . gabapentin  300 mg Oral QHS  . hydrALAZINE  25 mg Oral Q6H  . insulin aspart  0-20 Units Subcutaneous TID WC  . insulin aspart  0-5 Units Subcutaneous QHS  . insulin glargine  20 Units Subcutaneous Daily  . irbesartan  300 mg Oral Daily  . mouth rinse  15 mL Mouth Rinse q12n4p  . pantoprazole  40 mg Oral BID  . prednisoLONE acetate  1 drop Right Eye BID  . sodium chloride flush  3 mL Intravenous Q12H   Continuous Infusions: . sodium chloride    . sodium chloride    . heparin 1,250 Units/hr (02/13/17 2000)     Radiological Exams on Admission: Dg  Chest Port 1 View  Result Date: 02/13/2017 CLINICAL DATA:  Acute respiratory failure with hypoxia.  Dyspnea. EXAM: PORTABLE CHEST 1 VIEW COMPARISON:  Chest radiograph from one day prior. FINDINGS: Stable cardiomediastinal silhouette with cardiomegaly. No pneumothorax. Stable small to moderate bilateral pleural effusions. Mild-to-moderate pulmonary edema, slightly improved. Hazy bibasilar lung opacities, stable. IMPRESSION: 1. Mild-to-moderate congestive heart failure, slightly improved . 2. Stable small to moderate bilateral pleural effusions. 3. Stable hazy bibasilar lung opacities, favor atelectasis. Electronically Signed   By: Delbert Phenix M.D.   On: 02/13/2017 07:47    Impression/Recommendations Active Problems:   Elevated troponin   Pressure injury of skin   Acute respiratory failure with hypoxia (HCC)   Dyspnea   Acute pulmonary edema (HCC)   Hyperglycemia   HCAP (healthcare-associated pneumonia)   Acute diastolic heart failure (HCC)   Non-ST elevation (NSTEMI) myocardial infarction [facility])   Coronary artery disease involving native coronary artery of native heart with unstable angina pectoris (HCC)    NSTEMI: pt unable to undergo LHC at this time per primary team due to climbing WBC and concern for infectious process. Troponin peaked at 7.88 with new left bundle branch block noted on EKG. - Continue mgt per primary team w/ ASA, Plavix, IV Hep, BBlocker and Statin.   Acute on Chronic Respiratory failure: Patient's baseline O2 requirement is 2 L when necessary, typically with exertion or at night. Patient initially admitted on 02/02/2017 at [facility] for treatment of respiratory failure secondary to CAP and sepsis. Upon arrival at [facility] patient's antibiotics (cefepime) were stopped on 7/8. Doubt recurrence of pneumonia at this time. At this point time and after reviewing serial chest x-rays, lab values it would appear that patient's ongoing respiratory symptoms  are likely due to decompensated heart failure. Portion patient's renal function appears to be at baseline and would likely tolerate and perhaps improve with diuresis. Situation likely worsened by NSTEMI resulting in worsening cardiac function.  - Lasix 60IV BID - I/O, Daily wts - procalcitonin,  Respiratory viral panel - CXR in am - CBC w/ Diff on 7/11  Leukocytosis: WBC remain elevated but stable at 14.5 (range since admission 12.8-14.5). Likely reactive secondary to acute stress of hospitalization, recent pneumonia and sepsis and recent NSTEMI. Currently no signs of infectious process. Blood Cx negative obtained on 02/10/17. Pt remains afebrile and feels well in general. - If becomes febrile then consider repeat BCX  - UA - Additional workup as above.   CKD: current Cr. 1.56. Near baseline of 1.45 per review of records through care everywhere. Per report Cr was as high as 3 [facility] at time of initial admission w/ sepsis and respiratory failure on 02/02/17. Diuresis as above.  - BMP in am  DM: - continue SSI  HTN: - continue current regimen  Stage II sacral decubitus ulcer: Daughter feels this developed after hospitalization [facility]. Wound care team had been following patient during admission [facility] but signed off due to wound being stable on 02-13-17. Vertical well healing ulcer/tear at the apex of the gluteal cleft.  - Continue w/ duoderm - Consider air overlay mattress if pt becomes more sedentary or if lesion worsens.  - Continue daily wound checks by nursing staff.  Thank you for this consultation.  Our [facility] hospitalist team will follow the patient with you.   Time Spent: 60  MERRELL, DAVID J M.D. [facility] 02/14/2017, 2:41 PM        \

## 2017-02-14 NOTE — NC FL2 (Signed)
[place] MEDICAID FL2 LEVEL OF CARE SCREENING TOOL     IDENTIFICATION  Patient Name: Meagan Roberts Birthdate: 06-23-1927 Sex: female Admission Date (Current Location): 02/10/2017  [facility] and [place] Number:  [employer] and Address:  [facility], [address] [place], [place] [address]      Provider Number: [id number]  Attending Physician Name and Address:  Thurmon Fair, MD  Relative Name and Phone Number:       Current Level of Care: Hospital Recommended Level of Care: Skilled Nursing Facility Prior Approval Number:    Date Approved/Denied:   PASRR Number: [id number]  Discharge Plan: SNF    Current Diagnoses: Patient Active Problem List   Diagnosis Date Noted  . Non-ST elevation (NSTEMI) myocardial infarction (HCC)   . Coronary artery disease involving native coronary artery of native heart with unstable angina pectoris (HCC)   . Dyspnea   . Acute pulmonary edema (HCC)   . Hyperglycemia   . HCAP (healthcare-associated pneumonia)   . Acute diastolic heart failure (HCC)   . Pressure injury of skin 02/11/2017  . Acute respiratory failure with hypoxia (HCC)   . Elevated troponin 02/10/2017    Orientation RESPIRATION BLADDER Height & Weight     Self, Time, Situation, Place  O2 (see DC summary) Incontinent, External catheter Weight: 235 lb 0.2 oz (106.6 kg) Height:  5\' 5"  (165.1 cm)  BEHAVIORAL SYMPTOMS/MOOD NEUROLOGICAL BOWEL NUTRITION STATUS      Incontinent Diet (carb modified)  AMBULATORY STATUS COMMUNICATION OF NEEDS Skin   Extensive Assist Verbally PU Stage and Appropriate Care   PU Stage 2 Dressing:  (located on sacrum- foam dressing PRN)                   Personal Care Assistance Level of Assistance  Bathing, Dressing Bathing Assistance: Maximum assistance   Dressing Assistance: Maximum assistance     Functional Limitations Info             SPECIAL CARE FACTORS FREQUENCY  PT (By licensed PT), OT (By  licensed OT)     PT Frequency: 5/wk OT Frequency: 5/wk            Contractures      Additional Factors Info  Code Status, Allergies, Insulin Sliding Scale Code Status Info: FULL Allergies Info: Atorvastatin, Penicillins, Sulfur   Insulin Sliding Scale Info: 5/day       Current Medications (02/14/2017):  This is the current hospital active medication list Current Facility-Administered Medications  Medication Dose Route Frequency Provider Last Rate Last Dose  . 0.9 %  sodium chloride infusion  250 mL Intravenous PRN Duke, Roe Rutherford, PA      . 0.9% sodium chloride infusion  1 mL/kg/hr Intravenous Continuous Duke, Roe Rutherford, PA      . acetaminophen (TYLENOL) tablet 650 mg  650 mg Oral Q4H PRN Bhagat, Bhavinkumar, PA   650 mg at 02/13/17 1542  . ALPRAZolam Prudy Feeler) tablet 0.25 mg  0.25 mg Oral QHS PRN Lyman Speller, MD   0.25 mg at 02/13/17 2236  . alum & mag hydroxide-simeth (MAALOX/MYLANTA) 200-200-20 MG/5ML suspension 30 mL  30 mL Oral Q4H PRN Rollene Rotunda, MD   30 mL at 02/12/17 1544  . amLODipine (NORVASC) tablet 10 mg  10 mg Oral Daily Croitoru, Mihai, MD   10 mg at 02/14/17 0948  . aspirin EC tablet 81 mg  81 mg Oral Daily Bhagat, Bhavinkumar, PA   81 mg at  02/13/17 1106  . carvedilol (COREG) tablet 25 mg  25 mg Oral Daily Bhagat, Bhavinkumar, PA   25 mg at 02/14/17 0949  . chlorhexidine (PERIDEX) 0.12 % solution 15 mL  15 mL Mouth Rinse BID Croitoru, Mihai, MD   15 mL at 02/14/17 0949  . clopidogrel (PLAVIX) tablet 75 mg  75 mg Oral Daily Bhagat, Bhavinkumar, PA   75 mg at 02/14/17 0949  . colchicine tablet 0.6 mg  0.6 mg Oral Daily Bhagat, Bhavinkumar, PA   0.6 mg at 02/14/17 0949  . feeding supplement (ENSURE ENLIVE) (ENSURE ENLIVE) liquid 237 mL  237 mL Oral BID BM Croitoru, Mihai, MD   237 mL at 02/12/17 1000  . feeding supplement (PRO-STAT SUGAR FREE 64) liquid 30 mL  30 mL Oral BID Croitoru, Mihai, MD   30 mL at 02/14/17 0950  . gabapentin (NEURONTIN) capsule  300 mg  300 mg Oral QHS Bhagat, Bhavinkumar, PA   300 mg at 02/13/17 2235  . heparin ADULT infusion 100 units/mL (25000 units/25350mL sodium chloride 0.45%)  1,250 Units/hr Intravenous Continuous Earnie LarssonWilson, Frank R, RPH 12.5 mL/hr at 02/13/17 2000 1,250 Units/hr at 02/13/17 2000  . hydrALAZINE (APRESOLINE) tablet 25 mg  25 mg Oral Q6H Rollene RotundaHochrein, James, MD   25 mg at 02/14/17 0620  . insulin aspart (novoLOG) injection 0-20 Units  0-20 Units Subcutaneous TID WC Karl ItoSommer, Steven E, MD   3 Units at 02/14/17 [phone]  . insulin aspart (novoLOG) injection 0-5 Units  0-5 Units Subcutaneous QHS Karl ItoSommer, Steven E, MD   4 Units at 02/12/17 2208  . insulin glargine (LANTUS) injection 20 Units  20 Units Subcutaneous Daily Ollis, Brandi L, NP   20 Units at 02/14/17 0954  . irbesartan (AVAPRO) tablet 300 mg  300 mg Oral Daily Croitoru, Mihai, MD   300 mg at 02/14/17 0949  . MEDLINE mouth rinse  15 mL Mouth Rinse q12n4p Croitoru, Mihai, MD   15 mL at 02/13/17 1755  . nitroGLYCERIN (NITROSTAT) SL tablet 0.4 mg  0.4 mg Sublingual Q5 Min x 3 PRN Bhagat, Bhavinkumar, PA      . ondansetron (ZOFRAN) injection 4 mg  4 mg Intravenous Q6H PRN Bhagat, Bhavinkumar, PA      . pantoprazole (PROTONIX) EC tablet 40 mg  40 mg Oral BID Bhagat, Bhavinkumar, PA   40 mg at 02/14/17 0949  . prednisoLONE acetate (PRED FORTE) 1 % ophthalmic suspension 1 drop  1 drop Right Eye BID CrowderBhagat, Bhavinkumar, PA   1 drop at 02/14/17 0954  . sodium chloride flush (NS) 0.9 % injection 3 mL  3 mL Intravenous Q12H Marcelino Dusteruke, Angela Nicole, PA   3 mL at 02/14/17 0953  . sodium chloride flush (NS) 0.9 % injection 3 mL  3 mL Intravenous PRN Kateri Mcuke, Roe RutherfordAngela Nicole, PA         Discharge Medications: Please see discharge summary for a list of discharge medications.  Relevant Imaging Results:  Relevant Lab Results:   Additional Information SS#: [id number]  Burna SisUris, Breaker Springer H, LCSW

## 2017-02-14 NOTE — Progress Notes (Signed)
ANTICOAGULATION CONSULT NOTE - Follow Up Consult  Pharmacy Consult for heparin Indication: nstemi  Allergies  Allergen Reactions  . Atorvastatin Other (See Comments)    Other reaction(s): Myalgias (intolerance)  . Penicillins Itching and Rash    Family members do not know the specifics  . Sulfur Itching and Rash    ITCHING & RASH    Patient Measurements: Height: 5\' 5"  (165.1 cm) Weight: 235 lb 0.2 oz (106.6 kg) IBW/kg (Calculated) : 57  Vital Signs: Temp: 98.1 F (36.7 C) (07/10 0800) Temp Source: Oral (07/10 0800) BP: 139/81 (07/10 0800) Pulse Rate: 84 (07/10 0800)  Labs:  Recent Labs  02/12/17 0206 02/13/17 0221 02/14/17 0244  HGB 11.2* 11.6* 11.9*  HCT 35.3* 36.6 37.2  PLT 303 252 281  LABPROT  --   --  15.0  INR  --   --  1.17  HEPARINUNFRC 0.54 0.48 0.38  CREATININE 1.59* 1.65* 1.56*    Estimated Creatinine Clearance: 30.8 mL/min (A) (by C-G formula based on SCr of 1.56 mg/dL (H)).  Assessment: 83 year old female [facility]. Patient is currently receiving IV heparin for nstemi. Heparin level is at goal. Plans noted for cath (cancelled for today due to WBC elevation).  Goal of Therapy:  Heparin level 0.3-0.7 units/ml Monitor platelets by anticoagulation protocol: Yes   Plan:  Continue heparin at 1250 units/hr Daily heparin level and CBC  Harland GermanAndrew Shalah Estelle, Pharm D 02/14/2017 9:49 AM

## 2017-02-14 NOTE — Progress Notes (Signed)
Progress Note  Patient Name: Meagan Roberts Date of Encounter: 02/14/2017  Primary Cardiologist: Dr. Royann Shiversroitoru  Subjective   No CP or SOB  Inpatient Medications    Scheduled Meds: . amLODipine  10 mg Oral Daily  . aspirin EC  81 mg Oral Daily  . carvedilol  25 mg Oral Daily  . chlorhexidine  15 mL Mouth Rinse BID  . clopidogrel  75 mg Oral Daily  . colchicine  0.6 mg Oral Daily  . feeding supplement (ENSURE ENLIVE)  237 mL Oral BID BM  . feeding supplement (PRO-STAT SUGAR FREE 64)  30 mL Oral BID  . gabapentin  300 mg Oral QHS  . hydrALAZINE  25 mg Oral Q6H  . insulin aspart  0-20 Units Subcutaneous TID WC  . insulin aspart  0-5 Units Subcutaneous QHS  . insulin glargine  20 Units Subcutaneous Daily  . irbesartan  300 mg Oral Daily  . mouth rinse  15 mL Mouth Rinse q12n4p  . pantoprazole  40 mg Oral BID  . prednisoLONE acetate  1 drop Right Eye BID  . sodium chloride flush  3 mL Intravenous Q12H   Continuous Infusions: . sodium chloride    . sodium chloride    . heparin 1,250 Units/hr (02/13/17 2000)   PRN Meds: sodium chloride, acetaminophen, ALPRAZolam, alum & mag hydroxide-simeth, nitroGLYCERIN, ondansetron (ZOFRAN) IV, sodium chloride flush   Vital Signs    Vitals:   02/14/17 0400 02/14/17 0500 02/14/17 0600 02/14/17 0620  BP: 129/70 (!) 158/72  (!) 158/81  Pulse: 82 85    Resp: 20 17    Temp:      TempSrc:      SpO2: 96% 96%    Weight:   235 lb 0.2 oz (106.6 kg)   Height:        Intake/Output Summary (Last 24 hours) at 02/14/17 0725 Last data filed at 02/14/17 0500  Gross per 24 hour  Intake           1253.5 ml  Output             1400 ml  Net           -146.5 ml   Filed Weights   02/13/17 0321 02/13/17 0600 02/14/17 0600  Weight: 236 lb 8.9 oz (107.3 kg) 236 lb 8.9 oz (107.3 kg) 235 lb 0.2 oz (106.6 kg)    Telemetry    NSR - Personally Reviewed  ECG    No new EKG - Personally Reviewed  Physical Exam   GEN: No acute distress.   Neck:  No JVD Cardiac: RRR, no murmurs, rubs, or gallops.  Respiratory: Clear to auscultation bilaterally. GI: Soft, nontender, non-distended  MS: No edema; No deformity. Neuro:  Nonfocal  Psych: Normal affect   Labs    Chemistry Recent Labs Lab 02/12/17 0206 02/13/17 0221 02/14/17 0244  NA 136 134* 136  K 3.7 3.4* 3.8  CL 95* 92* 92*  CO2 33* 36* 36*  GLUCOSE 241* 291* 97  BUN 21* 20 20  CREATININE 1.59* 1.65* 1.56*  CALCIUM 9.1 8.8* 8.9  GFRNONAA 28* 27* 29*  GFRAA 33* 31* 33*  ANIONGAP 8 6 8      Hematology Recent Labs Lab 02/12/17 0206 02/13/17 0221 02/14/17 0244  WBC 13.8* 12.8* 14.5*  RBC 4.47 4.66 4.69  HGB 11.2* 11.6* 11.9*  HCT 35.3* 36.6 37.2  MCV 79.0 78.5 79.3  MCH 25.1* 24.9* 25.4*  MCHC 31.7 31.7 32.0  RDW 16.6*  16.3* 16.8*  PLT 303 252 281    Cardiac Enzymes Recent Labs Lab 02/10/17 1814 02/11/17 0122 02/11/17 0728  TROPONINI 4.66* 7.88* 6.06*   No results for input(s): TROPIPOC in the last 168 hours.   BNP Recent Labs Lab 02/10/17 1814 02/13/17 0910  BNP 331.9* 219.7*     DDimer No results for input(s): DDIMER in the last 168 hours.   Radiology    Dg Chest Port 1 View  Result Date: 02/13/2017 CLINICAL DATA:  Acute respiratory failure with hypoxia.  Dyspnea. EXAM: PORTABLE CHEST 1 VIEW COMPARISON:  Chest radiograph from one day prior. FINDINGS: Stable cardiomediastinal silhouette with cardiomegaly. No pneumothorax. Stable small to moderate bilateral pleural effusions. Mild-to-moderate pulmonary edema, slightly improved. Hazy bibasilar lung opacities, stable. IMPRESSION: 1. Mild-to-moderate congestive heart failure, slightly improved . 2. Stable small to moderate bilateral pleural effusions. 3. Stable hazy bibasilar lung opacities, favor atelectasis. Electronically Signed   By: Delbert Phenix M.D.   On: 02/13/2017 07:47    Cardiac Studies   none  Patient Profile     83 y.o. female  with a history of CAD status post remote stenting,  stroke, diabetes, hypertension, hyperlipidemia, and depression who transferred from [facility] for chest pain.    Assessment & Plan    1.  SOB - CXR with pleural effusions.  She is neg 1.4L from yesterday and net neg 5.2L.  Creatinine bumped slightly at 1.56 (1.47 on admit) yesterday and Lasix held. She has a few crackles at bases which could be atelectasis.  BNP trending downward.  Will hold Lasix today for now.  Repeat BMET in am.  2.  NSTEMI - peaked at 7.88 (4.66>>7.88>>6.06).  Trop higher than expected for demand ischemia and has new LBBB.  Creatinine down to 1.56.  Plan was for cath today with cors only and no LV gram but WBC is climbing so will cancel cath for now.  Repeat 2D echo showed mild LV dysfunction with EF 40-45% with mid to apical septal HK and HK of the mid to apical inferior wall.   (EF on 02/02/17 at [facility] showed EF 50-55%).   Continue ASA, Plavix, IV heparin,  BB and statin.   3.  CKD stage III - creatinine slightly improved today after holding Lasix - repeat BMET in am.  4.  HTN - Bp borderline controlled.  Continue amlodipine, ARB and carvedilol.  Increase Hydralazine to 50mg  QID.  5.  ASCAD with remote PCI - continue ASA. BB and Plavix.  Not on statin due to intolerance.  Consider lipid clinic as oupt for PSCK 9 drug.  6.  DM with elevated BS - check HbA1C.  Will ask TRH to help.  6.  History of respiratory failure with PNA - treated recently at [facility] with IV antbx.  CBC continues to climb ? Etiology.  Will ask TRH to assume primary for noncardiac problems and we will follow for cardiac issues.   Signed, [name], MD  02/14/2017, 7:25 AM

## 2017-02-14 NOTE — Clinical Social Work Note (Signed)
Clinical Social Work Assessment  Patient Details  Name: Meagan Roberts MRN: [id number] Date of Birth: 01/11/1927  Date of referral:  02/14/17               Reason for consult:  Facility Placement                Permission sought to share information with:  Oceanographeracility Contact Representative Permission granted to share information::  Yes, Verbal Permission Granted  Name::     Corporate treasurerMary  Agency::  SNF  Relationship::  dtr  Contact Information:     Housing/Transportation Living arrangements for the past 2 months:  Single Family Home Source of Information:  Patient, Adult Children Patient Interpreter Needed:  None Criminal Activity/Legal Involvement Pertinent to Current Situation/Hospitalization:  No - Comment as needed Significant Relationships:  Adult Children Lives with:    Do you feel safe going back to the place where you live?  No Need for family participation in patient care:  Yes (Comment) (decision making)  Care giving concerns:  Pt does not have sufficient support to return home at this time given current impairment.   Social Worker assessment / plan:  CSW spoke with pt and pt dtr concerning report that they had planned on [facility] following [facility] admission.  Pt listed as alert and oriented but did not have recollection of this plan and was expressing opposition to rehab placement.  Pt dtr stepped in and redirected patient and confirmed that this was the plan to get rehab when DC'd from [facility]- [facility] ([facility]  Employment status:  Retired Health and safety inspectornsurance information:  Medicare PT Recommendations:  Skilled Nursing Facility Information / Referral to community resources:  Skilled Nursing Facility  Patient/Family's Response to care:  Pt dtr still agreeable to SNF stay if appropriate for patient- patient agreeable after dtr re-explained rehab placement.  Patient/Family's Understanding of and Emotional  Response to Diagnosis, Current Treatment, and Prognosis:  No questions or concerns at this time- hopeful for pt recovery and ability to participate in rehab program when stable.  Emotional Assessment Appearance:  Appears stated age Attitude/Demeanor/Rapport:  Inconsistent Affect (typically observed):    Orientation:  Oriented to Self, Oriented to Place, Oriented to  Time, Oriented to Situation Alcohol / Substance use:  Not Applicable Psych involvement (Current and /or in the community):  No (Comment)  Discharge Needs  Concerns to be addressed:  Care Coordination Readmission within the last 30 days:  No Current discharge risk:  Physical Impairment Barriers to Discharge:  Continued Medical Work up   Burna SisUris, [name] H, LCSW 02/14/2017, 10:28 AM

## 2017-02-15 ENCOUNTER — Encounter (HOSPITAL_COMMUNITY)
Admission: AD | Disposition: A | Discharge status: Skilled Nursing Facility | Payer: Self-pay | Source: Other Acute Inpatient Hospital | Attending: Cardiovascular Disease

## 2017-02-15 ENCOUNTER — Inpatient Hospital Stay (HOSPITAL_COMMUNITY): Payer: Medicare HMO

## 2017-02-15 DIAGNOSIS — I2510 Atherosclerotic heart disease of native coronary artery without angina pectoris: Secondary | ICD-10-CM

## 2017-02-15 HISTORY — PX: LEFT HEART CATH AND CORONARY ANGIOGRAPHY: CATH118249

## 2017-02-15 LAB — BASIC METABOLIC PANEL
ANION GAP: 10 (ref 5–15)
BUN: 21 mg/dL — AB (ref 6–20)
CO2: 33 mmol/L — ABNORMAL HIGH (ref 22–32)
Calcium: 8.8 mg/dL — ABNORMAL LOW (ref 8.9–10.3)
Chloride: 95 mmol/L — ABNORMAL LOW (ref 101–111)
Creatinine, Ser: 1.55 mg/dL — ABNORMAL HIGH (ref 0.44–1.00)
GFR calc Af Amer: 34 mL/min — ABNORMAL LOW (ref >60)
GFR, EST NON AFRICAN AMERICAN: 29 mL/min — AB (ref >60)
GLUCOSE: 146 mg/dL — AB (ref 65–99)
POTASSIUM: 3.9 mmol/L (ref 3.5–5.1)
Sodium: 138 mmol/L (ref 135–145)

## 2017-02-15 LAB — RESPIRATORY PANEL BY PCR
ADENOVIRUS-RVPPCR: NOT DETECTED
Bordetella pertussis: NOT DETECTED
CHLAMYDOPHILA PNEUMONIAE-RVPPCR: NOT DETECTED
CORONAVIRUS 229E-RVPPCR: NOT DETECTED
CORONAVIRUS HKU1-RVPPCR: NOT DETECTED
CORONAVIRUS NL63-RVPPCR: NOT DETECTED
Coronavirus OC43: NOT DETECTED
Influenza A: NOT DETECTED
Influenza B: NOT DETECTED
MYCOPLASMA PNEUMONIAE-RVPPCR: NOT DETECTED
Metapneumovirus: NOT DETECTED
PARAINFLUENZA VIRUS 3-RVPPCR: NOT DETECTED
Parainfluenza Virus 1: NOT DETECTED
Parainfluenza Virus 2: NOT DETECTED
Parainfluenza Virus 4: NOT DETECTED
Respiratory Syncytial Virus: NOT DETECTED
Rhinovirus / Enterovirus: NOT DETECTED

## 2017-02-15 LAB — HEPARIN LEVEL (UNFRACTIONATED): HEPARIN UNFRACTIONATED: 0.41 [IU]/mL (ref 0.30–0.70)

## 2017-02-15 LAB — CBC WITH DIFFERENTIAL/PLATELET
BASOS ABS: 0 10*3/uL (ref 0.0–0.1)
Basophils Relative: 0 %
Eosinophils Absolute: 0.5 10*3/uL (ref 0.0–0.7)
Eosinophils Relative: 4 %
HCT: 34.9 % — ABNORMAL LOW (ref 36.0–46.0)
HEMOGLOBIN: 11.1 g/dL — AB (ref 12.0–15.0)
LYMPHS ABS: 2.6 10*3/uL (ref 0.7–4.0)
LYMPHS PCT: 25 %
MCH: 25.3 pg — AB (ref 26.0–34.0)
MCHC: 31.8 g/dL (ref 30.0–36.0)
MCV: 79.7 fL (ref 78.0–100.0)
Monocytes Absolute: 0.6 10*3/uL (ref 0.1–1.0)
Monocytes Relative: 6 %
NEUTROS ABS: 6.8 10*3/uL (ref 1.7–7.7)
NEUTROS PCT: 65 %
Platelets: 271 10*3/uL (ref 150–400)
RBC: 4.38 MIL/uL (ref 3.87–5.11)
RDW: 16.9 % — ABNORMAL HIGH (ref 11.5–15.5)
WBC: 10.5 10*3/uL (ref 4.0–10.5)

## 2017-02-15 LAB — GLUCOSE, CAPILLARY
GLUCOSE-CAPILLARY: 150 mg/dL — AB (ref 65–99)
GLUCOSE-CAPILLARY: 134 mg/dL — AB (ref 65–99)
GLUCOSE-CAPILLARY: 160 mg/dL — AB (ref 65–99)
Glucose-Capillary: 222 mg/dL — ABNORMAL HIGH (ref 65–99)

## 2017-02-15 LAB — CULTURE, BLOOD (ROUTINE X 2)
CULTURE: NO GROWTH
Culture: NO GROWTH
Special Requests: ADEQUATE
Special Requests: ADEQUATE

## 2017-02-15 SURGERY — LEFT HEART CATH AND CORONARY ANGIOGRAPHY
Anesthesia: LOCAL

## 2017-02-15 MED ORDER — SODIUM CHLORIDE 0.9 % IV SOLN
INTRAVENOUS | Status: DC
  Administered 2017-02-15: 10 mL/h via INTRAVENOUS

## 2017-02-15 MED ORDER — SODIUM CHLORIDE 0.9% FLUSH: 3.0000 mL | INTRAVENOUS | Status: DC | PRN

## 2017-02-15 MED ORDER — SODIUM CHLORIDE 0.9 % IV SOLN: INTRAVENOUS | Status: DC

## 2017-02-15 MED ORDER — HEPARIN SODIUM (PORCINE) 5000 UNIT/ML IJ SOLN
5000.0000 [IU] | Freq: Three times a day (TID) | INTRAMUSCULAR | Status: DC
Start: 2017-02-15 — End: 2017-02-20
  Administered 2017-02-15 – 2017-02-20 (×14): 5000 [IU] via SUBCUTANEOUS
  Filled 2017-02-15 (×14): qty 1

## 2017-02-15 MED ORDER — SODIUM CHLORIDE 0.9% FLUSH
3.0000 mL | Freq: Two times a day (BID) | INTRAVENOUS | Status: DC
  Administered 2017-02-15 – 2017-02-20 (×8): 3 mL via INTRAVENOUS

## 2017-02-15 MED ORDER — ASPIRIN 81 MG PO CHEW
81.0000 mg | CHEWABLE_TABLET | ORAL | Status: AC
  Administered 2017-02-15: 81 mg via ORAL
  Filled 2017-02-15: qty 1

## 2017-02-15 MED ORDER — ALPRAZOLAM 0.25 MG PO TABS
0.2500 mg | ORAL_TABLET | Freq: Every evening | ORAL | Status: DC | PRN
  Administered 2017-02-15 – 2017-02-16 (×2): 0.25 mg via ORAL
  Filled 2017-02-15 (×2): qty 1

## 2017-02-15 MED ORDER — PRO-STAT SUGAR FREE PO LIQD
30.0000 mL | Freq: Two times a day (BID) | ORAL | Status: DC
  Administered 2017-02-15 – 2017-02-20 (×10): 30 mL via ORAL
  Filled 2017-02-15 (×8): qty 30

## 2017-02-15 MED ORDER — HEPARIN SODIUM (PORCINE) 1000 UNIT/ML IJ SOLN
INTRAMUSCULAR | Status: AC
  Filled 2017-02-15: qty 1

## 2017-02-15 MED ORDER — HEPARIN (PORCINE) IN NACL 2-0.9 UNIT/ML-% IJ SOLN
INTRAMUSCULAR | Status: AC
  Filled 2017-02-15: qty 1000

## 2017-02-15 MED ORDER — IOPAMIDOL (ISOVUE-370) INJECTION 76%
INTRAVENOUS | Status: AC
  Filled 2017-02-15: qty 100

## 2017-02-15 MED ORDER — CARVEDILOL 12.5 MG PO TABS
12.5000 mg | ORAL_TABLET | Freq: Two times a day (BID) | ORAL | Status: DC
  Administered 2017-02-15 – 2017-02-20 (×10): 12.5 mg via ORAL
  Filled 2017-02-15 (×10): qty 1

## 2017-02-15 MED ORDER — HEPARIN SODIUM (PORCINE) 1000 UNIT/ML IJ SOLN
INTRAMUSCULAR | Status: DC | PRN
  Administered 2017-02-15: 5000 [IU] via INTRAVENOUS

## 2017-02-15 MED ORDER — VERAPAMIL HCL 2.5 MG/ML IV SOLN
INTRAVENOUS | Status: DC | PRN
  Administered 2017-02-15: 3 mL via INTRA_ARTERIAL

## 2017-02-15 MED ORDER — LIDOCAINE HCL 1 % IJ SOLN
INTRAMUSCULAR | Status: AC
  Filled 2017-02-15: qty 20

## 2017-02-15 MED ORDER — VERAPAMIL HCL 2.5 MG/ML IV SOLN
INTRAVENOUS | Status: AC
  Filled 2017-02-15: qty 2

## 2017-02-15 MED ORDER — HEPARIN (PORCINE) IN NACL 2-0.9 UNIT/ML-% IJ SOLN
INTRAMUSCULAR | Status: AC | PRN
  Administered 2017-02-15: 1000 mL

## 2017-02-15 MED ORDER — SODIUM CHLORIDE 0.9 % IV SOLN: 250.0000 mL | INTRAVENOUS | Status: DC | PRN

## 2017-02-15 MED ORDER — IOPAMIDOL (ISOVUE-370) INJECTION 76%
INTRAVENOUS | Status: DC | PRN
  Administered 2017-02-15: 40 mL via INTRAVENOUS

## 2017-02-15 MED ORDER — FUROSEMIDE 10 MG/ML IJ SOLN
INTRAMUSCULAR | Status: AC
Start: 2017-02-15 — End: 2017-02-15
  Filled 2017-02-15: qty 4

## 2017-02-15 MED ORDER — SODIUM CHLORIDE 0.9% FLUSH: 3.0000 mL | Freq: Two times a day (BID) | INTRAVENOUS | Status: DC

## 2017-02-15 MED ORDER — LIDOCAINE HCL (PF) 1 % IJ SOLN
INTRAMUSCULAR | Status: DC | PRN
  Administered 2017-02-15: 2 mL

## 2017-02-15 SURGICAL SUPPLY — 11 items
CATH OPTITORQUE JACKY 4.0 5F (CATHETERS) ×1 IMPLANT
DEVICE RAD COMP TR BAND LRG (VASCULAR PRODUCTS) ×1 IMPLANT
GLIDESHEATH SLEND SS 6F .021 (SHEATH) ×1 IMPLANT
GUIDEWIRE INQWIRE 1.5J.035X260 (WIRE) IMPLANT
HOVERMATT SINGLE USE (MISCELLANEOUS) ×1 IMPLANT
INQWIRE 1.5J .035X260CM (WIRE) ×2
KIT HEART LEFT (KITS) ×2 IMPLANT
PACK CARDIAC CATHETERIZATION (CUSTOM PROCEDURE TRAY) ×2 IMPLANT
TRANSDUCER W/STOPCOCK (MISCELLANEOUS) ×2 IMPLANT
TUBING CIL FLEX 10 FLL-RA (TUBING) ×2 IMPLANT
WIRE HI TORQ VERSACORE-J 145CM (WIRE) ×1 IMPLANT

## 2017-02-15 NOTE — Progress Notes (Signed)
Nutrition Follow-up  DOCUMENTATION CODES:   Obesity unspecified  INTERVENTION:   Continue: 30 ml Prostat BID  Add Magic cup with meals  NUTRITION DIAGNOSIS:   Increased nutrient needs related to wound healing, acute illness (Sepsis) as evidenced by estimated needs. Ongoing.   GOAL:   Patient will meet greater than or equal to 90% of their needs Progressing.   MONITOR:   PO intake, Supplement acceptance, I & O's, Labs, Weight trends  ASSESSMENT:   83 y/o female PMHx CAD, DM, HTN, HLD, Depression, CVA, MI. Ad[facility] for respiratory failure r/t PNA. Found to have new Left BBB. She was [facility] for further cardiology care. On arrival, CXR shows probable CHF w/ Pulmonary edema. Rd consulted for nutritional assessment.   Pt with pleural effusions, she is negative 3.9 L and down  Pt currently NPO for cath today Meal Completion: 50-75%% Limited intake of ensure documented as pt refusing or had just eaten a meal.  CBG's: 158-150   Diet Order:  Diet NPO time specified  Skin:   (stage II sacrum)  Last BM:  7/10 medium  Height:   Ht Readings from Last 1 Encounters:  02/13/17 5\' 5"  (1.651 m)    Weight:   Wt Readings from Last 1 Encounters:  02/15/17 236 lb 8.9 oz (107.3 kg)    Ideal Body Weight:  56.82 kg  BMI:  Body mass index is 39.36 kg/m.  Estimated Nutritional Needs:   Kcal:  1800-2000 kcals (17-19 kcal/kg bw)  Protein:  85-97 (1.5-1.7 g/kg ibw)  Fluid:  Per MD  EDUCATION NEEDS:   No education needs identified at this time  Kendell BaneHeather Shary Lamos RD, LDN, CNSC [phone] Pager [phone] After Hours Pager

## 2017-02-15 NOTE — Progress Notes (Signed)
Progress Note  Patient Name: Meagan Roberts Date of Encounter: 02/15/2017  Primary Cardiologist: Dr. Royann Shivers  Subjective   Patient denies any chest pain or SOB  Inpatient Medications    Scheduled Meds: . amLODipine  10 mg Oral Daily  . aspirin EC  81 mg Oral Daily  . carvedilol  25 mg Oral Daily  . chlorhexidine  15 mL Mouth Rinse BID  . clopidogrel  75 mg Oral Daily  . colchicine  0.6 mg Oral Daily  . feeding supplement (ENSURE ENLIVE)  237 mL Oral BID BM  . feeding supplement (PRO-STAT SUGAR FREE 64)  30 mL Oral BID  . furosemide  60 mg Intravenous BID  . gabapentin  300 mg Oral QHS  . hydrALAZINE  25 mg Oral Q6H  . insulin aspart  0-20 Units Subcutaneous TID WC  . insulin aspart  0-5 Units Subcutaneous QHS  . insulin glargine  20 Units Subcutaneous Daily  . irbesartan  300 mg Oral Daily  . mouth rinse  15 mL Mouth Rinse q12n4p  . pantoprazole  40 mg Oral BID  . prednisoLONE acetate  1 drop Right Eye BID  . sodium chloride flush  3 mL Intravenous Q12H   Continuous Infusions: . sodium chloride    . sodium chloride    . heparin 1,250 Units/hr (02/14/17 2300)   PRN Meds: sodium chloride, acetaminophen, alum & mag hydroxide-simeth, nitroGLYCERIN, ondansetron (ZOFRAN) IV, sodium chloride flush   Vital Signs    Vitals:   02/15/17 0400 02/15/17 0430 02/15/17 0500 02/15/17 0600  BP: (!) 137/59  117/63 129/72  Pulse: 79 80 83 80  Resp: 17 12 16 16   Temp:      TempSrc:      SpO2: 96% 93% 97% 97%  Weight:    237 lb 3.4 oz (107.6 kg)  Height:        Intake/Output Summary (Last 24 hours) at 02/15/17 0659 Last data filed at 02/15/17 0600  Gross per 24 hour  Intake             3066 ml  Output             2025 ml  Net             1041 ml   Filed Weights   02/13/17 0600 02/14/17 0600 02/15/17 0600  Weight: 236 lb 8.9 oz (107.3 kg) 235 lb 0.2 oz (106.6 kg) 237 lb 3.4 oz (107.6 kg)    Telemetry    NSR - Personally Reviewed  ECG    No new EKG to review -  Personally Reviewed  Physical Exam   GEN:WD WN in NAD  Neck: no bruit Cardiac: RRR with no M/R/G Respiratory:CTA bilaterally GI: soft, NT, NT with active BS MS: no edema Neuro: A&O x 3 Psych: normal mood  Labs    Chemistry  Recent Labs Lab 02/13/17 0221 02/14/17 0244 02/15/17 0238  NA 134* 136 138  K 3.4* 3.8 3.9  CL 92* 92* 95*  CO2 36* 36* 33*  GLUCOSE 291* 97 146*  BUN 20 20 21*  CREATININE 1.65* 1.56* 1.55*  CALCIUM 8.8* 8.9 8.8*  GFRNONAA 27* 29* 29*  GFRAA 31* 33* 34*  ANIONGAP 6 8 10      Hematology  Recent Labs Lab 02/13/17 0221 02/14/17 0244 02/15/17 0238  WBC 12.8* 14.5* 10.5  RBC 4.66 4.69 4.38  HGB 11.6* 11.9* 11.1*  HCT 36.6 37.2 34.9*  MCV 78.5 79.3 79.7  MCH 24.9* 25.4*  25.3*  MCHC 31.7 32.0 31.8  RDW 16.3* 16.8* 16.9*  PLT 252 281 271    Cardiac Enzymes  Recent Labs Lab 02/10/17 1814 02/11/17 0122 02/11/17 0728  TROPONINI 4.66* 7.88* 6.06*   No results for input(s): TROPIPOC in the last 168 hours.   BNP  Recent Labs Lab 02/10/17 1814 02/13/17 0910  BNP 331.9* 219.7*     DDimer No results for input(s): DDIMER in the last 168 hours.   Radiology    No results found.  Cardiac Studies   none  Patient Profile     83 y.o. female  with a history of CAD status post remote stenting, stroke, diabetes, hypertension, hyperlipidemia, and depression who transferred from [facility] for chest [facility].    Assessment & Plan    1.  SOB - CXR with pleural effusions.   - She is neg 2L from yesterday and net neg 4.1L.   - Creatinine continues to improve an is  1.55 today (1.47 on admit) .    - She is now back on Lasix IV BID.  BNP trending downward.    2.  NSTEMI - peaked at 7.88 (4.66>>7.88>>6.06).  Trop higher than expected for demand ischemia and has new LBBB.   - Creatinine down to 1.55.   - WBC back to normal and afebrile -  Repeat 2D echo showed mild LV dysfunction with EF 40-45% with mid to apical septal HK and HK  of the mid to apical inferior wall.   (EF on [facility] showed EF 50-55%).   - Plan for cath today with cors only and no LV gram - Continue ASA, Plavix, IV heparin,  BB and statin.   3.  CKD stage III - creatinine slightly improved today after restarting Lasix  4.  HTN - Bp for the most part is controlled.   - Continue amlodipine, ARB and carvedilol and hydralazine  5.  ASCAD with remote PCI  - continue ASA. BB and Plavix.  - - Not on statin due to intolerance.   - Consider lipid clinic as oupt for PSCK 9 drug.  6.  DM  - BS have improved - check HbA1C.   - continue SS Insulin  6.  History of respiratory failure with PNA - treated recently at [facility] with IV antbx.   - CBC now back down to normal and procalcitonin ias normal.   - appreciate TRH help  7.  Stage II sacral Decubitus ulcer - continue duoderm - daily would checks by nursing  Signed, Armanda Magicraci Nyemah Watton, MD  02/15/2017, 6:59 AM

## 2017-02-15 NOTE — Progress Notes (Signed)
ANTICOAGULATION CONSULT NOTE - Follow Up Consult  Pharmacy Consult for heparin Indication: nstemi  Allergies  Allergen Reactions  . Atorvastatin Other (See Comments)    Other reaction(s): Myalgias (intolerance)  . Penicillins Itching and Rash    Family members do not know the specifics  . Sulfur Itching and Rash    ITCHING & RASH    Patient Measurements: Height: 5\' 5"  (165.1 cm) Weight: 236 lb 8.9 oz (107.3 kg) IBW/kg (Calculated) : 57  Vital Signs: Temp: 98.3 F (36.8 C) (07/11 0800) Temp Source: Oral (07/11 0800) BP: 127/62 (07/11 1000) Pulse Rate: 77 (07/11 1000)  Labs:  Recent Labs  02/13/17 0221 02/14/17 0244 02/15/17 0238  HGB 11.6* 11.9* 11.1*  HCT 36.6 37.2 34.9*  PLT 252 281 271  LABPROT  --  15.0  --   INR  --  1.17  --   HEPARINUNFRC 0.48 0.38 0.41  CREATININE 1.65* 1.56* 1.55*    Estimated Creatinine Clearance: 31.1 mL/min (A) (by C-G formula based on SCr of 1.55 mg/dL (H)).  Assessment: 83 year old female transferred [facility]. Patient is currently receiving IV heparin for nstemi. Heparin level is at goal. Plans noted for cath today.  Goal of Therapy:  Heparin level 0.3-0.7 units/ml Monitor platelets by anticoagulation protocol: Yes   Plan:  Continue heparin at 1250 units/hr Will follow plans post cath  Harland GermanAndrew Omolara Carol, Pharm D 02/15/2017 10:32 AM

## 2017-02-15 NOTE — Consult Note (Signed)
[place] TEAM 1 - Stepdown/ICU TEAM CONSULT F/U NOTE  Meagan HartMary F Roberts RUE:[id number] DOB: 03/18/1927 DOA: 02/10/2017 PCP: Meagan Roberts  Admit HPI / Brief Narrative: 83 y.o. female with a history of asthma, CHF, CKD, DM, HTN, CAD, and stroke who [facility] to [facility] she was admitted from 06/28 for acute hypoxic respiratory failure and sepsis secondary to pneumonia. During her [facility] at [facility] she was noted to have a troponin of 0.99 and a new left bundle branch block. She was transferred to [facility] for a cardiology evaluation. At time of her arrival patient she was placed in the ICU on BiPAP.    HPI/Subjective: Laying in bed telling me "I feel horrible."  She denies specific focal complaints.  She tells me she is not experiencing chest pain or sob.     Recommendations/Plan:  NSTEMI - CAD w/ remote PCI Per primary service (Cardiology) - for cardiac cath today  Acute on chronic hypoxic resp failure - pulmonary edema / B pleural effusions On home O2 at 2LPM w/ exertion - completed tx [facility] CAP [facility] - WBC normal, afebrile, and procalcitonin not strongly suggestive of pulm infxn   CKD stage 3 Baseline crt 1.45 - follow renal fxn w/ ongoing diuresis   Recent Labs Lab 02/11/17 0728 02/12/17 0206 02/13/17 0221 02/14/17 0244 02/15/17 0238  CREATININE 1.43* 1.59* 1.65* 1.56* 1.55*   DM CBG reasonably controlled - follow trend w/o change today   HTN Well controlled at this time   Stage 2 sacral decubitus ulcer WOC has seen the pt and signed off   Obesity - Body mass index is 39.36 kg/m.  Code Status: FULL Family Communication: no family present at time of exam  Antibiotics: Cefepime 7/7 > 7/8 Vanc 7/6  DVT prophylaxis: IV heparin   Objective: Blood pressure 127/62, pulse 77, temperature 98.3 F (36.8 C), temperature source Oral, resp. rate 19, height 5\' 5"  (1.651 m), weight 107.3 kg (236 lb 8.9 oz), SpO2 95  %.  Intake/Output Summary (Last 24 hours) at 02/15/17 1050 Last data filed at 02/15/17 1000  Gross per 24 hour  Intake             3186 ml  Output             2025 ml  Net             1161 ml     Exam: General: No acute respiratory distress Lungs: Clear to auscultation bilaterally without wheezes or crackles Cardiovascular: Regular rate and rhythm without murmur gallop or rub normal S1 and S2 Abdomen: Nontender, nondistended, soft, bowel sounds positive, no rebound, no ascites, no appreciable mass Extremities: No significant cyanosis, clubbing, or edema bilateral lower extremities  Data Reviewed: Basic Metabolic Panel:  Recent Labs Lab 02/11/17 0728 02/12/17 0206 02/13/17 0221 02/14/17 0244 02/15/17 0238  NA 140 136 134* 136 138  K 4.1 3.7 3.4* 3.8 3.9  CL 100* 95* 92* 92* 95*  CO2 30 33* 36* 36* 33*  GLUCOSE 233* 241* 291* 97 146*  BUN 21* 21* 20 20 21*  CREATININE 1.43* 1.59* 1.65* 1.56* 1.55*  CALCIUM 9.1 9.1 8.8* 8.9 8.8*    CBC:  Recent Labs Lab 02/11/17 0728 02/12/17 0206 02/13/17 0221 02/14/17 0244 02/15/17 0238  WBC 13.2* 13.8* 12.8* 14.5* 10.5  NEUTROABS  --   --   --   --  6.8  HGB 11.8* 11.2* 11.6* 11.9* 11.1*  HCT 36.3 35.3* 36.6 37.2 34.9*  MCV 78.7 79.0 78.5 79.3 79.7  PLT 252 303 252 281 271    Liver Function Tests: No results for input(s): AST, ALT, ALKPHOS, BILITOT, PROT, ALBUMIN in the last 168 hours. No results for input(s): LIPASE, AMYLASE in the last 168 hours. No results for input(s): AMMONIA in the last 168 hours.  Coags:  Recent Labs Lab 02/14/17 0244  INR 1.17    Cardiac Enzymes:  Recent Labs Lab 02/10/17 1814 02/11/17 0122 02/11/17 0728  TROPONINI 4.66* 7.88* 6.06*    CBG:  Recent Labs Lab 02/14/17 0822 02/14/17 1214 02/14/17 1620 02/14/17 2210 02/15/17 0801  GLUCAP 126* 193* 240* 158* 150*    Recent Results (from the past 240 hour(s))  MRSA PCR Screening     Status: None   Collection Time: 02/10/17   2:39 PM  Result Value Ref Range Status   MRSA by PCR NEGATIVE NEGATIVE Final    Comment:        The GeneXpert MRSA Assay (FDA approved for NASAL specimens only), is one component of a comprehensive MRSA colonization surveillance program. It is not intended to diagnose MRSA infection nor to guide or monitor treatment for MRSA infections.   Culture, blood (Routine X 2) w Reflex to ID Panel     Status: None (Preliminary result)   Collection Time: 02/10/17  4:53 PM  Result Value Ref Range Status   Specimen Description BLOOD RIGHT HAND  Final   Special Requests IN PEDIATRIC BOTTLE Blood Culture adequate volume  Final   Culture NO GROWTH 4 DAYS  Final   Report Status PENDING  Incomplete  Culture, blood (Routine X 2) w Reflex to ID Panel     Status: None (Preliminary result)   Collection Time: 02/10/17  4:58 PM  Result Value Ref Range Status   Specimen Description BLOOD LEFT ANTECUBITAL  Final   Special Requests IN PEDIATRIC BOTTLE Blood Culture adequate volume  Final   Culture NO GROWTH 4 DAYS  Final   Report Status PENDING  Incomplete     Studies:   Recent x-ray studies have been reviewed in detail by the Attending Physician  Scheduled Meds:  Scheduled Meds: . amLODipine  10 mg Oral Daily  . aspirin EC  81 mg Oral Daily  . carvedilol  25 mg Oral Daily  . chlorhexidine  15 mL Mouth Rinse BID  . clopidogrel  75 mg Oral Daily  . colchicine  0.6 mg Oral Daily  . feeding supplement (ENSURE ENLIVE)  237 mL Oral BID BM  . feeding supplement (PRO-STAT SUGAR FREE 64)  30 mL Oral BID  . furosemide  60 mg Intravenous BID  . gabapentin  300 mg Oral QHS  . hydrALAZINE  25 mg Oral Q6H  . insulin aspart  0-20 Units Subcutaneous TID WC  . insulin aspart  0-5 Units Subcutaneous QHS  . insulin glargine  20 Units Subcutaneous Daily  . irbesartan  300 mg Oral Daily  . mouth rinse  15 mL Mouth Rinse q12n4p  . pantoprazole  40 mg Oral BID  . prednisoLONE acetate  1 drop Right Eye BID  .  sodium chloride flush  3 mL Intravenous Q12H  . sodium chloride flush  3 mL Intravenous Q12H    Lonia Blood , Roberts   [facility] Office  [phone] Pager - Text Page per Loretha Stapler as per below:  On-Call/Text Page:      [email or web address]      password [facility]  If 7PM-7AM, please contact night-coverage [email or web address] Password [email or web address] 02/15/2017, 10:50 AM   LOS: 5 days

## 2017-02-16 ENCOUNTER — Encounter (HOSPITAL_COMMUNITY): Payer: Self-pay | Admitting: Cardiovascular Disease

## 2017-02-16 LAB — CBC
HCT: 37 % (ref 36.0–46.0)
Hemoglobin: 11.9 g/dL — ABNORMAL LOW (ref 12.0–15.0)
MCH: 25.7 pg — AB (ref 26.0–34.0)
MCHC: 32.2 g/dL (ref 30.0–36.0)
MCV: 79.9 fL (ref 78.0–100.0)
PLATELETS: 307 10*3/uL (ref 150–400)
RBC: 4.63 MIL/uL (ref 3.87–5.11)
RDW: 17.3 % — AB (ref 11.5–15.5)
WBC: 7 10*3/uL (ref 4.0–10.5)

## 2017-02-16 LAB — BASIC METABOLIC PANEL
ANION GAP: 9 (ref 5–15)
BUN: 22 mg/dL — ABNORMAL HIGH (ref 6–20)
CHLORIDE: 93 mmol/L — AB (ref 101–111)
CO2: 34 mmol/L — ABNORMAL HIGH (ref 22–32)
Calcium: 9 mg/dL (ref 8.9–10.3)
Creatinine, Ser: 1.62 mg/dL — ABNORMAL HIGH (ref 0.44–1.00)
GFR calc non Af Amer: 27 mL/min — ABNORMAL LOW (ref >60)
GFR, EST AFRICAN AMERICAN: 32 mL/min — AB (ref >60)
Glucose, Bld: 182 mg/dL — ABNORMAL HIGH (ref 65–99)
POTASSIUM: 4 mmol/L (ref 3.5–5.1)
SODIUM: 136 mmol/L (ref 135–145)

## 2017-02-16 LAB — GLUCOSE, CAPILLARY
GLUCOSE-CAPILLARY: 174 mg/dL — AB (ref 65–99)
GLUCOSE-CAPILLARY: 166 mg/dL — AB (ref 65–99)
Glucose-Capillary: 179 mg/dL — ABNORMAL HIGH (ref 65–99)
Glucose-Capillary: 200 mg/dL — ABNORMAL HIGH (ref 65–99)

## 2017-02-16 LAB — PROCALCITONIN: Procalcitonin: 0.2 ng/mL

## 2017-02-16 MED ORDER — INSULIN GLARGINE 100 UNIT/ML ~~LOC~~ SOLN
25.0000 [IU] | Freq: Every day | SUBCUTANEOUS | Status: DC
  Administered 2017-02-17 – 2017-02-20 (×4): 25 [IU] via SUBCUTANEOUS
  Filled 2017-02-16 (×4): qty 0.25

## 2017-02-16 MED ORDER — ISOSORBIDE MONONITRATE ER 30 MG PO TB24
30.0000 mg | ORAL_TABLET | Freq: Every day | ORAL | Status: DC
  Administered 2017-02-16 – 2017-02-18 (×3): 30 mg via ORAL
  Filled 2017-02-16 (×3): qty 1

## 2017-02-16 NOTE — Progress Notes (Addendum)
Progress Note  Patient Name: Meagan Roberts Date of Encounter: 02/16/2017  Primary Cardiologist: Dr. Royann Shivers  Subjective   Denies any chest pain or pressure. No SOB.  Inpatient Medications    Scheduled Meds: . amLODipine  10 mg Oral Daily  . aspirin EC  81 mg Oral Daily  . carvedilol  12.5 mg Oral BID WC  . chlorhexidine  15 mL Mouth Rinse BID  . clopidogrel  75 mg Oral Daily  . colchicine  0.6 mg Oral Daily  . feeding supplement (ENSURE ENLIVE)  237 mL Oral BID BM  . feeding supplement (PRO-STAT SUGAR FREE 64)  30 mL Oral BID  . furosemide  60 mg Intravenous BID  . gabapentin  300 mg Oral QHS  . heparin  5,000 Units Subcutaneous Q8H  . hydrALAZINE  25 mg Oral Q6H  . insulin aspart  0-20 Units Subcutaneous TID WC  . insulin aspart  0-5 Units Subcutaneous QHS  . [START ON 02/17/2017] insulin glargine  25 Units Subcutaneous Daily  . irbesartan  300 mg Oral Daily  . mouth rinse  15 mL Mouth Rinse q12n4p  . pantoprazole  40 mg Oral BID  . prednisoLONE acetate  1 drop Right Eye BID  . sodium chloride flush  3 mL Intravenous Q12H   Continuous Infusions: . sodium chloride Stopped (02/16/17 0000)   PRN Meds: sodium chloride, acetaminophen, ALPRAZolam, alum & mag hydroxide-simeth, nitroGLYCERIN, ondansetron (ZOFRAN) IV, sodium chloride flush   Vital Signs    Vitals:   02/16/17 0800 02/16/17 1000 02/16/17 1200 02/16/17 1249  BP: 134/64 (!) 115/58 (!) 128/58   Pulse: 76 83 78   Resp: 16 (!) 21 15   Temp:    99.2 F (37.3 C)  TempSrc:    Oral  SpO2: 100% 93% 97%   Weight:      Height:        Intake/Output Summary (Last 24 hours) at 02/16/17 1257 Last data filed at 02/16/17 1200  Gross per 24 hour  Intake           1007.5 ml  Output             4900 ml  Net          -3892.5 ml   Filed Weights   02/14/17 0600 02/15/17 0600 02/16/17 0600  Weight: 235 lb 0.2 oz (106.6 kg) 236 lb 8.9 oz (107.3 kg) 235 lb 10.8 oz (106.9 kg)    Telemetry    NSR - Personally  Reviewed  ECG    No new EKG to review - Personally Reviewed  Physical Exam   GEN:WD, WN in NAD Neck: no bruits Cardiac: RRR with no M/R/G Respiratory: CTA bilaterally GI: soft, NT, ND with active BS MS: no edema Neuro:A&O x 3 Psych: normal affect  Labs    Chemistry  Recent Labs Lab 02/13/17 0221 02/14/17 0244 02/15/17 0238  NA 134* 136 138  K 3.4* 3.8 3.9  CL 92* 92* 95*  CO2 36* 36* 33*  GLUCOSE 291* 97 146*  BUN 20 20 21*  CREATININE 1.65* 1.56* 1.55*  CALCIUM 8.8* 8.9 8.8*  GFRNONAA 27* 29* 29*  GFRAA 31* 33* 34*  ANIONGAP 6 8 10      Hematology  Recent Labs Lab 02/14/17 0244 02/15/17 0238 02/16/17 0500  WBC 14.5* 10.5 7.0  RBC 4.69 4.38 4.63  HGB 11.9* 11.1* 11.9*  HCT 37.2 34.9* 37.0  MCV 79.3 79.7 79.9  MCH 25.4* 25.3* 25.7*  MCHC 32.0  31.8 32.2  RDW 16.8* 16.9* 17.3*  PLT 281 271 307    Cardiac Enzymes  Recent Labs Lab 02/10/17 1814 02/11/17 0122 02/11/17 0728  TROPONINI 4.66* 7.88* 6.06*   No results for input(s): TROPIPOC in the last 168 hours.   BNP  Recent Labs Lab 02/10/17 1814 02/13/17 0910  BNP 331.9* 219.7*     DDimer No results for input(s): DDIMER in the last 168 hours.   Radiology    Dg Chest Port 1 View  Result Date: 02/15/2017 CLINICAL DATA:  Shortness of Breath EXAM: PORTABLE CHEST 1 VIEW COMPARISON:  February 13, 2017 FINDINGS: There remains generalized interstitial edema. There is probable patchy alveolar edema in the bases. There are bilateral pleural effusions. There is atelectatic change in each mid lung. There is cardiomegaly with pulmonary venous hypertension. No adenopathy is evident. There is aortic atherosclerosis. There is postoperative change in the left clavicle. IMPRESSION: Findings indicative of congestive heart failure, similar to most recent study. There is new patchy mid lung atelectasis bilaterally. There is aortic atherosclerosis. Aortic Atherosclerosis (ICD10-I70.0). Electronically Signed   By:  Bretta BangWilliam  Woodruff III M.D.   On: 02/15/2017 08:46    Cardiac Studies  Cardiac Cath 02/15/2017 Conclusion     Mid LAD lesion, 70 %stenosed.  Ost 1st Diag to 1st Diag lesion, 95 %stenosed.  Prox RCA lesion, 90 %stenosed.  Mid RCA lesion, 100 %stenosed.  Prox Cx lesion, 80 %stenosed.   1. Significant heavily calcified 3 vessel coronary artery disease with chronically occluded right coronary artery with left-to-right collaterals, small left circumflex with diffuse proximal disease and borderline significant mid LAD disease which appears to be a complex plaque with the appearance of double-lumen. 2. Severely elevated left ventricular end-diastolic pressure at 36 mmHg.  Recommendations: No straightforward PCI options given diffusely diseased and heavily calcified vessels. Recommend medical therapy for coronary artery disease. The patient's bigger issue seems to be heart failure with severely elevated left ventricular end-diastolic pressure. Continue aggressive diuresis. The procedure was very difficult as the patient was very orthopneic and could not hold still for the procedure. I gave her one dose of IV furosemide at the end of the case and placed a Foley catheter for aggressive diuresis.      Patient Profile     83 y.o. female  with a history of CAD status post remote stenting, stroke, diabetes, hypertension, hyperlipidemia, and depression who [facility].    Assessment & Plan    1.  Acute systolic CHF with DCM and EF 40-45% on echo - She is neg 3.6L from yesterday and net neg 9.3L overall.   - Creatinine bumped this am from 1.62>>1.66 (1.47 on admit) .    - Change Lasix to 80mg  PO BID (was on 60mg  IV BID) - continue BB, ARB, hydralazine and long acting nitrate. - check BMET in am  2.  NSTEMI - peaked at 7.88 (4.66>>7.88>>6.06).  Trop higher than expected for demand ischemia and has new LBBB.   -  Repeat 2D echo showed mild LV  dysfunction with EF 40-45% with mid to apical septal HK and HK of the mid to apical inferior wall.   ([facility] 50-55[facility]   - cath revealed severe 3 vessel ASCAD with 70% mid LAD, 95% ostial D1, 90% prox RCA and then 100% mid RCA with left to right collaterals and 80% prox CX.  Medical management recommended due to no straight forward PCI options due  to diffusely disease and heavily calcified vessels and with advanced age and co morbidities not a good CABG candidate. - Continue ASA, Plavix, BB and long acting nitrate - she is statin intolerant  3.  CKD stage III - creatinine bumped this am so will change to PO Lasix - repeat BMET in am  4.  HTN - Bp is well controlled.   - Continue amlodipine, ARB and carvedilol and hydralazine  5.  ASCAD with remote PCI  - see above cath results - medical management - continue ASA. BB, long acting nitrate and Plavix.   - Not on statin due to intolerance.   - Consider lipid clinic as oupt for PSCK 9 drug.  6.  DM  - BS have improved - HbA1C pending - continue SS Insulin - appreciate TRH help  6.  History of respiratory failure with PNA - treated recently at [facility] with IV antbx.   - CBC now back down to normal and procalcitonin ias normal.   - appreciate TRH help  7.  Stage II sacral Decubitus ulcer - continue duoderm - daily would checks by nursing  8.  Borderline low K - replete and check BMET in am  9.  DVT prophylaxis - SQ Heparin  10.  Disposition - awaiting PT consult  Signed, [name], MD  02/16/2017, 12:57 PM

## 2017-02-16 NOTE — Progress Notes (Signed)
PROGRESS NOTE  Meagan Roberts  ZOX:[id number] DOB: 03-03-1927 DOA: 02/10/2017 PCP: Olive Bass, MD   Brief Narrative: 83 y.o.femalewith a history of asthma, CHF, CKD, DM, HTN, CAD, and stroke who presented to [facility] in transfer from [facility] where she was admitted from 06/28 for acute hypoxic respiratory failure and sepsis secondary to pneumonia. During her stay at [facility] was treated for pneumonia but noted to have a troponin of 0.99 and a new left bundle branch block. She was transferred to [facility] for a cardiology evaluation. At time of her arrival patient she was placed in the ICU on BiPAP. Diuresis was guided by cardiology. Echocardiogram showed EF 40-45% with mid-apical septal hypokinesis and mid-apical inferior hypokinesis. Cardiac catheterization 7/11 showed calcified 3 vessel CAD without PCI options. Medical management recommended.   Subjective: Reports feeling "much better" this afternoon, dyspnea is improving nearer to baseline, though still on more oxygen than usual. Denies chest pain. Not getting up.   Assessment & Plan: Active Problems:   Elevated troponin   Pressure injury of skin   Acute respiratory failure with hypoxia (HCC)   Dyspnea   Acute pulmonary edema (HCC)   Hyperglycemia   HCAP (healthcare-associated pneumonia)   Acute diastolic heart failure (HCC)   Non-ST elevation (NSTEMI) myocardial infarction [facility])   Coronary artery disease involving native coronary artery of native heart with unstable angina pectoris (HCC)   Acute combined systolic and diastolic congestive heart failure (HCC)   Acute on chronic respiratory failure with hypoxia (HCC)   Diabetes mellitus with complication (HCC)   Essential hypertension  Acute on chronic hypoxic resp failure: Initially due to pneumonia, though this no longer appears to be the issue. Afebrile, no leukocytosis, and PCT not suggestive of bacterial pneumonia. RVP negative.  - Currently requiring 6L HFNC,  up from home baseline of 2L by [place]. Wean as tolerated with treatment of conditions as below - Incentive spirometry encouraged as she is at significant risk for atelectasis. OOB as able.   NSTEMI due to CAD w/ remote PCI, 3 vessel severely calcified CAD confirmed on [facility] 7/11. No further chest pain at this time.  - Medical management recommended: Continue aspirin, plavix, coreg. No statin (atorvastatin allergy noted) per cardiology.   Hyperlipidemia:  - Consider lipid clinic follow up for PCSK9.   Acute systolic CHF: EF 81-19% with regional hypokinesis on echocardiogram earlier this admission. - Continue diuresis per cardiology; currently 60mg  IV BID (40mg  po BID at home) - Continue ARB, carvedilol.  - Strict I/O, daily weights.   Stage III CKD: Baseline crt 1.45  - Monitor renal function panel daily with ongoing diuresis   T2DM: CBGs at inpatient goal x48 hours, suspect infection-related hyperglycemia initially.  - Check HbA1c - Will mildly increase lantus 20u > 25u daily (50u daily at home) based on elevated fasting CBG + continue resistant SSI (15, 15, 20u w/meals at home), as she is not eating as carb-rich diet as she was at home.   HTN: Chronic, stable - Continue medications as above and hydralazine.  - Defer decision Re: norvasc to cardiology with reduced EF. May be used for antianginal effect, though would favor titrating beta blocker.   Stage II sacral decubitus ulcer:  - WOC has seen the pt: Continue wound care by RN, duoderm, offloading as able  Obesity - Body mass index is 39.36 kg/m. - PT evaluation ordered  DVT prophylaxis: Subcutaneous heparin Code Status: Full Family Communication: Daughter at bedside Disposition Plan: Per  primary. Therapy evaluations ordered  Procedures:  Left Heart Cath and Coronary Angiography     Mid LAD lesion, 70 %stenosed.  Ost 1st Diag to 1st Diag lesion, 95 %stenosed.  Prox RCA lesion, 90 %stenosed.  Mid RCA lesion, 100  %stenosed.  Prox Cx lesion, 80 %stenosed.   1. Significant heavily calcified 3 vessel coronary artery disease with chronically occluded right coronary artery with left-to-right collaterals, small left circumflex with diffuse proximal disease and borderline significant mid LAD disease which appears to be a complex plaque with the appearance of double-lumen. 2. Severely elevated left ventricular end-diastolic pressure at 36 mmHg.  Recommendations: No straightforward PCI options given diffusely diseased and heavily calcified vessels. Recommend medical therapy for coronary artery disease. The patient's bigger issue seems to be heart failure with severely elevated left ventricular end-diastolic pressure. Continue aggressive diuresis. The procedure was very difficult as the patient was very orthopneic and could not hold still for the procedure. I gave her one dose of IV furosemide at the end of the case and placed a Foley catheter for aggressive diuresis.   Objective: BP (!) 115/58   Pulse 83   Temp 98.2 F (36.8 C) (Oral)   Resp (!) 21   Ht 5\' 5"  (1.651 m)   Wt 106.9 kg (235 lb 10.8 oz)   SpO2 93%   BMI 39.22 kg/m   General exam: Pleasant, obese, elderly female in no distress Respiratory system: Labored toward moderately long sentences, tachypneic on 6L by [place]. Bibasilar crackles. Cardiovascular system: Regular rate and sinus rhythm on monitor. Distant but no murmur, rub, or gallop. No JVD appreciated, trace LE edema. Gastrointestinal system: Abdomen soft, non-tender, non-distended, with normoactive bowel sounds. No organomegaly or masses felt. GU: Foley catheter in situ. Central nervous system: Alert and oriented. No focal neurological deficits. Extremities: Warm, no deformities Skin: No rashes, lesions no ulcers Psychiatry: Judgement and insight appear normal. Mood & affect appropriate.   Data Reviewed: I have personally reviewed following labs and imaging studies  CBC:  Recent  Labs Lab 02/12/17 0206 02/13/17 0221 02/14/17 0244 02/15/17 0238 02/16/17 0500  WBC 13.8* 12.8* 14.5* 10.5 7.0  NEUTROABS  --   --   --  6.8  --   HGB 11.2* 11.6* 11.9* 11.1* 11.9*  HCT 35.3* 36.6 37.2 34.9* 37.0  MCV 79.0 78.5 79.3 79.7 79.9  PLT 303 252 281 271 307   Basic Metabolic Panel:  Recent Labs Lab 02/11/17 0728 02/12/17 0206 02/13/17 0221 02/14/17 0244 02/15/17 0238  NA 140 136 134* 136 138  K 4.1 3.7 3.4* 3.8 3.9  CL 100* 95* 92* 92* 95*  CO2 30 33* 36* 36* 33*  GLUCOSE 233* 241* 291* 97 146*  BUN 21* 21* 20 20 21*  CREATININE 1.43* 1.59* 1.65* 1.56* 1.55*  CALCIUM 9.1 9.1 8.8* 8.9 8.8*   GFR: Estimated Creatinine Clearance: 31.1 mL/min (A) (by C-G formula based on SCr of 1.55 mg/dL (H)). Liver Function Tests: No results for input(s): AST, ALT, ALKPHOS, BILITOT, PROT, ALBUMIN in the last 168 hours. No results for input(s): LIPASE, AMYLASE in the last 168 hours. No results for input(s): AMMONIA in the last 168 hours. Coagulation Profile:  Recent Labs Lab 02/14/17 0244  INR 1.17   Cardiac Enzymes:  Recent Labs Lab 02/10/17 1814 02/11/17 0122 02/11/17 0728  TROPONINI 4.66* 7.88* 6.06*   BNP (last 3 results) No results for input(s): PROBNP in the last 8760 hours. HbA1C: No results for input(s): HGBA1C in the last 72  hours. CBG:  Recent Labs Lab 02/15/17 0801 02/15/17 1141 02/15/17 1704 02/15/17 2130 02/16/17 0744  GLUCAP 150* 134* 160* 222* 174*   Lipid Profile: No results for input(s): CHOL, HDL, LDLCALC, TRIG, CHOLHDL, LDLDIRECT in the last 72 hours. Thyroid Function Tests: No results for input(s): TSH, T4TOTAL, FREET4, T3FREE, THYROIDAB in the last 72 hours. Anemia Panel: No results for input(s): VITAMINB12, FOLATE, FERRITIN, TIBC, IRON, RETICCTPCT in the last 72 hours. Urine analysis:    Component Value Date/Time   COLORURINE STRAW (A) 02/14/2017 2036   APPEARANCEUR HAZY (A) 02/14/2017 2036   LABSPEC 1.003 (L) 02/14/2017 2036    PHURINE 6.0 02/14/2017 2036   GLUCOSEU NEGATIVE 02/14/2017 2036   HGBUR SMALL (A) 02/14/2017 2036   BILIRUBINUR NEGATIVE 02/14/2017 2036   KETONESUR NEGATIVE 02/14/2017 2036   PROTEINUR 30 (A) 02/14/2017 2036   NITRITE NEGATIVE 02/14/2017 2036   LEUKOCYTESUR SMALL (A) 02/14/2017 2036   Recent Results (from the past 240 hour(s))  MRSA PCR Screening     Status: None   Collection Time: 02/10/17  2:39 PM  Result Value Ref Range Status   MRSA by PCR NEGATIVE NEGATIVE Final    Comment:        The GeneXpert MRSA Assay (FDA approved for NASAL specimens only), is one component of a comprehensive MRSA colonization surveillance program. It is not intended to diagnose MRSA infection nor to guide or monitor treatment for MRSA infections.   Culture, blood (Routine X 2) w Reflex to ID Panel     Status: None   Collection Time: 02/10/17  4:53 PM  Result Value Ref Range Status   Specimen Description BLOOD RIGHT HAND  Final   Special Requests IN PEDIATRIC BOTTLE Blood Culture adequate volume  Final   Culture NO GROWTH 5 DAYS  Final   Report Status 02/15/2017 FINAL  Final  Culture, blood (Routine X 2) w Reflex to ID Panel     Status: None   Collection Time: 02/10/17  4:58 PM  Result Value Ref Range Status   Specimen Description BLOOD LEFT ANTECUBITAL  Final   Special Requests IN PEDIATRIC BOTTLE Blood Culture adequate volume  Final   Culture NO GROWTH 5 DAYS  Final   Report Status 02/15/2017 FINAL  Final  Respiratory Panel by PCR     Status: None   Collection Time: 02/14/17  3:52 PM  Result Value Ref Range Status   Adenovirus NOT DETECTED NOT DETECTED Final   Coronavirus 229E NOT DETECTED NOT DETECTED Final   Coronavirus HKU1 NOT DETECTED NOT DETECTED Final   Coronavirus NL63 NOT DETECTED NOT DETECTED Final   Coronavirus OC43 NOT DETECTED NOT DETECTED Final   Metapneumovirus NOT DETECTED NOT DETECTED Final   Rhinovirus / Enterovirus NOT DETECTED NOT DETECTED Final   Influenza A NOT  DETECTED NOT DETECTED Final   Influenza B NOT DETECTED NOT DETECTED Final   Parainfluenza Virus 1 NOT DETECTED NOT DETECTED Final   Parainfluenza Virus 2 NOT DETECTED NOT DETECTED Final   Parainfluenza Virus 3 NOT DETECTED NOT DETECTED Final   Parainfluenza Virus 4 NOT DETECTED NOT DETECTED Final   Respiratory Syncytial Virus NOT DETECTED NOT DETECTED Final   Bordetella pertussis NOT DETECTED NOT DETECTED Final   Chlamydophila pneumoniae NOT DETECTED NOT DETECTED Final   Mycoplasma pneumoniae NOT DETECTED NOT DETECTED Final      Radiology Studies: Dg Chest Port 1 View  Result Date: 02/15/2017 CLINICAL DATA:  Shortness of Breath EXAM: PORTABLE CHEST 1 VIEW COMPARISON:  February 13, 2017 FINDINGS: There remains generalized interstitial edema. There is probable patchy alveolar edema in the bases. There are bilateral pleural effusions. There is atelectatic change in each mid lung. There is cardiomegaly with pulmonary venous hypertension. No adenopathy is evident. There is aortic atherosclerosis. There is postoperative change in the left clavicle. IMPRESSION: Findings indicative of congestive heart failure, similar to most recent study. There is new patchy mid lung atelectasis bilaterally. There is aortic atherosclerosis. Aortic Atherosclerosis (ICD10-I70.0). Electronically Signed   By: Bretta Bang III M.D.   On: 02/15/2017 08:46    Scheduled Meds: . amLODipine  10 mg Oral Daily  . aspirin EC  81 mg Oral Daily  . carvedilol  12.5 mg Oral BID WC  . chlorhexidine  15 mL Mouth Rinse BID  . clopidogrel  75 mg Oral Daily  . colchicine  0.6 mg Oral Daily  . feeding supplement (ENSURE ENLIVE)  237 mL Oral BID BM  . feeding supplement (PRO-STAT SUGAR FREE 64)  30 mL Oral BID  . furosemide  60 mg Intravenous BID  . gabapentin  300 mg Oral QHS  . heparin  5,000 Units Subcutaneous Q8H  . hydrALAZINE  25 mg Oral Q6H  . insulin aspart  0-20 Units Subcutaneous TID WC  . insulin aspart  0-5 Units  Subcutaneous QHS  . insulin glargine  20 Units Subcutaneous Daily  . irbesartan  300 mg Oral Daily  . mouth rinse  15 mL Mouth Rinse q12n4p  . pantoprazole  40 mg Oral BID  . prednisoLONE acetate  1 drop Right Eye BID  . sodium chloride flush  3 mL Intravenous Q12H   Continuous Infusions: . sodium chloride Stopped (02/16/17 0000)     LOS: 6 days   Time spent: 25 minutes.  Hazeline Junker, MD [facility] Pager [phone]  If 7PM-7AM, please contact night-coverage [email or web address] Password [email or web address] 02/16/2017, 12:06 PM

## 2017-02-17 LAB — GLUCOSE, CAPILLARY
GLUCOSE-CAPILLARY: 146 mg/dL — AB (ref 65–99)
Glucose-Capillary: 193 mg/dL — ABNORMAL HIGH (ref 65–99)
Glucose-Capillary: 231 mg/dL — ABNORMAL HIGH (ref 65–99)
Glucose-Capillary: 170 mg/dL — ABNORMAL HIGH (ref 65–99)

## 2017-02-17 LAB — BASIC METABOLIC PANEL
Anion gap: 8 (ref 5–15)
BUN: 26 mg/dL — ABNORMAL HIGH (ref 6–20)
CHLORIDE: 92 mmol/L — AB (ref 101–111)
CO2: 35 mmol/L — AB (ref 22–32)
Calcium: 8.7 mg/dL — ABNORMAL LOW (ref 8.9–10.3)
Creatinine, Ser: 1.66 mg/dL — ABNORMAL HIGH (ref 0.44–1.00)
GFR calc non Af Amer: 27 mL/min — ABNORMAL LOW (ref >60)
GFR, EST AFRICAN AMERICAN: 31 mL/min — AB (ref >60)
Glucose, Bld: 167 mg/dL — ABNORMAL HIGH (ref 65–99)
Potassium: 3.6 mmol/L (ref 3.5–5.1)
Sodium: 135 mmol/L (ref 135–145)

## 2017-02-17 LAB — MAGNESIUM: Magnesium: 1.5 mg/dL — ABNORMAL LOW (ref 1.7–2.4)

## 2017-02-17 MED ORDER — FUROSEMIDE 80 MG PO TABS
80.0000 mg | ORAL_TABLET | Freq: Two times a day (BID) | ORAL | Status: DC
  Administered 2017-02-17 – 2017-02-18 (×2): 80 mg via ORAL
  Filled 2017-02-17 (×2): qty 1

## 2017-02-17 MED ORDER — FUROSEMIDE 80 MG PO TABS: 80.0000 mg | ORAL_TABLET | Freq: Every day | ORAL | Status: DC

## 2017-02-17 MED ORDER — POTASSIUM CHLORIDE CRYS ER 20 MEQ PO TBCR
20.0000 meq | EXTENDED_RELEASE_TABLET | Freq: Once | ORAL | Status: AC
  Administered 2017-02-17: 20 meq via ORAL
  Filled 2017-02-17: qty 1

## 2017-02-17 MED ORDER — POTASSIUM CHLORIDE CRYS ER 20 MEQ PO TBCR: 20.0000 meq | EXTENDED_RELEASE_TABLET | Freq: Every day | ORAL | Status: DC

## 2017-02-17 NOTE — Evaluation (Signed)
Occupational Therapy Evaluation Patient Details Name: Meagan HartMary F Roberts MRN: [id number] DOB: 06/12/1927 Today's Date: 02/17/2017    History of Present Illness Patient is a 83 y/o female who was [facility] for acute hypoxic respiratory failure and sepsis secondary to pneumonia, [facility] 7[facility] due to new left BBB and elevated troponin. Cardiac cath  7/11 showed calcified 3 vessel CAD without PCI options being treated medically. CXR- CHF with edema. Admitted for NSTEMI. PMH includes asthma, CHF, CKD, DM, HTN, CAD, and stroke.   Clinical Impression   Pt admitted with above. She demonstrates the below listed deficits and will benefit from continued OT to maximize safety and independence with BADLs.  Pt presents to OT with generalized weakness, decreased activity tolerance, and impaired balance.  She currently requires min - mod A for ADLs.  She lives with daughter who assist with ADLs PRN.   Feel she would benefit from short SNF stay to maximize safety and independence prior to return home.  Will follow acutely.       Follow Up Recommendations  SNF;Supervision/Assistance - 24 hour    Equipment Recommendations  None recommended by OT    Recommendations for Other Services       Precautions / Restrictions Precautions Precautions: Fall Precaution Comments: watch 02 Restrictions Weight Bearing Restrictions: No      Mobility Bed Mobility Overal bed mobility: Needs Assistance Bed Mobility: Supine to Sit;Rolling Rolling: Min assist   Supine to sit: Min assist;HOB elevated     General bed mobility comments: Increased time to get to EOB and Min A to elevate trunk. No dizziness reported. Rolling to right/left to remove bed pan- use of rail to assist.   Transfers Overall transfer level: Needs assistance Equipment used: Rolling walker (2 wheeled) Transfers: Sit to/from Stand Sit to Stand: Min assist         General transfer comment: Assist to power to standing  with cues for hand placement/technique; stood from EOB x1, from w/c x1. SPT w/c to w/c to transfer to new floor.    Balance Overall balance assessment: Needs assistance Sitting-balance support: Feet supported;Single extremity supported Sitting balance-Leahy Scale: Fair     Standing balance support: During functional activity;Bilateral upper extremity supported Standing balance-Leahy Scale: Poor Standing balance comment: Reliant on BUEs for support in standing.                            ADL either performed or assessed with clinical judgement   ADL Overall ADL's : Needs assistance/impaired Eating/Feeding: Set up;Sitting   Grooming: Wash/dry hands;Wash/dry face;Oral care;Set up;Sitting   Upper Body Bathing: Minimal assistance;Sitting   Lower Body Bathing: Moderate assistance;Sit to/from stand   Upper Body Dressing : Set up;Sitting   Lower Body Dressing: Moderate assistance;Sit to/from stand   Toilet Transfer: Minimal assistance;+2 for safety/equipment;Ambulation;Comfort height toilet;BSC;Grab bars;RW   Toileting- Clothing Manipulation and Hygiene: Total assistance;Sit to/from stand       Functional mobility during ADLs: Minimal assistance;+2 for safety/equipment;Rolling walker General ADL Comments: Pt fatiques quickly with activity      Vision         Perception     Praxis      Pertinent Vitals/Pain Pain Assessment: No/denies pain     Hand Dominance     Extremity/Trunk Assessment Upper Extremity Assessment Upper Extremity Assessment: Generalized weakness   Lower Extremity Assessment Lower Extremity Assessment: Defer to PT evaluation   Cervical / Trunk Assessment Cervical /  Trunk Assessment: Normal   Communication Communication Communication: No difficulties   Cognition Arousal/Alertness: Awake/alert Behavior During Therapy: WFL for tasks assessed/performed Overall Cognitive Status: Within Functional Limits for tasks assessed                                  General Comments: for basic tasks but does not know date. Per daughter, pt masks symptoms and will not be forthcoming with them.   General Comments  Daughter present during session. Very anxious about mother's mobility.    Exercises     Shoulder Instructions      Home Living Family/patient expects to be discharged to:: Skilled nursing facility Living Arrangements: Children Available Help at Discharge: Family;Available 24 hours/day Type of Home: House Home Access: Level entry     Home Layout: One level     Bathroom Shower/Tub: Tub/shower unit         Home Equipment: Walker - 4 wheels;Cane - single point;Bedside commode;Shower seat;Wheelchair - manual;Grab bars - tub/shower          Prior Functioning/Environment Level of Independence: Needs assistance  Gait / Transfers Assistance Needed: Uses SPC vs rollator for ambulation.  ADL's / Homemaking Assistance Needed: Has an aide come in to assist with some IADLs and ADLs as needed. Pt able to dress self.   Comments: No falls reported.         OT Problem List: Decreased strength;Decreased activity tolerance;Impaired balance (sitting and/or standing);Decreased knowledge of use of DME or AE;Cardiopulmonary status limiting activity;Obesity      OT Treatment/Interventions: Self-care/ADL training;Therapeutic exercise;Energy conservation;DME and/or AE instruction;Therapeutic activities;Patient/family education;Balance training    OT Goals(Current goals can be found in the care plan section) Acute Rehab OT Goals Patient Stated Goal: to get out of this bed! OT Goal Formulation: With patient/family Time For Goal Achievement: 03/03/17 Potential to Achieve Goals: Good  OT Frequency: Min 2X/week   Barriers to D/C:            Co-evaluation PT/OT/SLP Co-Evaluation/Treatment: Yes Reason for Co-Treatment: For patient/therapist safety;To address functional/ADL transfers PT goals addressed during  session: Mobility/safety with mobility;Balance OT goals addressed during session: Proper use of Adaptive equipment and DME      AM-PAC PT "6 Clicks" Daily Activity     Outcome Measure Help from another person eating meals?: None Help from another person taking care of personal grooming?: A Little Help from another person toileting, which includes using toliet, bedpan, or urinal?: A Lot Help from another person bathing (including washing, rinsing, drying)?: A Lot Help from another person to put on and taking off regular upper body clothing?: A Lot Help from another person to put on and taking off regular lower body clothing?: A Lot 6 Click Score: 15   End of Session Equipment Utilized During Treatment: Gait belt;Rolling walker;Oxygen Nurse Communication: Mobility status  Activity Tolerance: Patient limited by fatigue Patient left: in chair;with call bell/phone within reach;with family/visitor present  OT Visit Diagnosis: Unsteadiness on feet (R26.81);Muscle weakness (generalized) (M62.81)                Time: 1610-9604 OT Time Calculation (min): 26 min Charges:  OT General Charges $OT Visit: 1 Procedure OT Evaluation $OT Eval Moderate Complexity: 1 Procedure G-Codes:     Jeani Hawking, OTR/L [phone]   Jeani Hawking M 02/17/2017, 2:45 PM

## 2017-02-17 NOTE — Progress Notes (Signed)
PROGRESS NOTE  Meagan Roberts  ZOX:[id number] DOB: Sep 29, 1926 DOA: 02/10/2017 PCP: Olive Bass, MD   Brief Narrative: 83 y.o.femalewith a history of asthma, CHF, CKD, DM, HTN, CAD, and stroke who presented to [facility] in transfer from [facility] where she was admitted from 06/28 for acute hypoxic respiratory failure and sepsis secondary to pneumonia. During her stay at [facility] was treated for pneumonia but noted to have a troponin of 0.99 and a new left bundle branch block. She was transferred to [facility] for a cardiology evaluation. At time of her arrival patient she was placed in the ICU on BiPAP. Diuresis was guided by cardiology. Echocardiogram showed EF 40-45% with mid-apical septal hypokinesis and mid-apical inferior hypokinesis. Cardiac catheterization 7/11 showed calcified 3 vessel CAD without PCI options. Medical management recommended.   Subjective: States having day-by-day improvement. No dyspnea at rest, no chest pain. Not eating as well as she does at home, and reports hyperglycemia at home. Ready to get up with PT today.   Assessment & Plan: Active Problems:   Elevated troponin   Pressure injury of skin   Acute respiratory failure with hypoxia (HCC)   Dyspnea   Acute pulmonary edema (HCC)   Hyperglycemia   HCAP (healthcare-associated pneumonia)   Acute diastolic heart failure (HCC)   Non-ST elevation (NSTEMI) myocardial infarction [facility])   Coronary artery disease involving native coronary artery of native heart with unstable angina pectoris (HCC)   Acute combined systolic and diastolic congestive heart failure (HCC)   Acute on chronic respiratory failure with hypoxia (HCC)   Diabetes mellitus with complication (HCC)   Essential hypertension  Acute on chronic hypoxic resp failure: Initially due to pneumonia, though this no longer appears to be the issue. Afebrile, no leukocytosis, and PCT not suggestive of bacterial pneumonia. RVP negative.  - Currently  requiring 4L, up from home baseline of 2L by [place]. Wean as tolerated with treatment of conditions as below. - Incentive spirometry encouraged as she is at significant risk for atelectasis. OOB as able.   NSTEMI due to CAD w/ remote PCI, 3 vessel severely calcified CAD confirmed on [facility] 7/11. No further chest pain at this time.  - Medical management recommended: Continue aspirin, plavix, coreg, imdur. No statin (atorvastatin allergy noted) per cardiology.   Hyperlipidemia:  - Consider lipid clinic follow up for PCSK9.   Acute systolic CHF: EF 81-19% with regional hypokinesis on echocardiogram earlier this admission. - Continue diuresis per cardiology; currently 60mg  IV BID (40mg  po BID at home) - Continue ARB, carvedilol.  - Strict I/O, daily weights.  - Currently has foley with IV diuresis. When deescalating to po, would pull foley as soon as possible to encourage ambulation.   Stage III CKD: Limited data to establish baseline Cr. Was 1.2 in 2016, 1.47 on admission.  - Monitor renal function panel daily with ongoing diuresis   T2DM: CBGs at inpatient goal x48 hours, suspect infection-related hyperglycemia initially.  - Check HbA1c, pending - Made incremental increase to lantus 20u > 25u daily (50u daily at home) based on elevated fasting CBG + continue resistant SSI (15, 15, 20u w/meals at home), as she is not eating as carb-rich diet as she was at home. Goal is < 180mg /dl.   HTN: Chronic, stable. Good control currently.  - Continue medications as above and hydralazine.  - Imdur added 7/12.   Stage II sacral decubitus ulcer:  - WOC has seen the pt: Continue wound care by RN, duoderm, offloading  as able  Obesity - Body mass index is 39.36 kg/m. - PT evaluation ordered  DVT prophylaxis: Subcutaneous heparin Code Status: Full Family Communication: Daughter at bedside Disposition Plan: Per primary. Therapy evaluations ordered. If still requiring HFNC, could consider [facility].    Procedures:  Left Heart Cath and Coronary Angiography     Mid LAD lesion, 70 %stenosed.  Ost 1st Diag to 1st Diag lesion, 95 %stenosed.  Prox RCA lesion, 90 %stenosed.  Mid RCA lesion, 100 %stenosed.  Prox Cx lesion, 80 %stenosed.   1. Significant heavily calcified 3 vessel coronary artery disease with chronically occluded right coronary artery with left-to-right collaterals, small left circumflex with diffuse proximal disease and borderline significant mid LAD disease which appears to be a complex plaque with the appearance of double-lumen. 2. Severely elevated left ventricular end-diastolic pressure at 36 mmHg.  Recommendations: No straightforward PCI options given diffusely diseased and heavily calcified vessels. Recommend medical therapy for coronary artery disease. The patient's bigger issue seems to be heart failure with severely elevated left ventricular end-diastolic pressure. Continue aggressive diuresis. The procedure was very difficult as the patient was very orthopneic and could not hold still for the procedure. I gave her one dose of IV furosemide at the end of the case and placed a Foley catheter for aggressive diuresis.   Objective: BP 117/64 (BP Location: Left Arm)   Pulse 74   Temp 98.3 F (36.8 C) (Oral)   Resp 16   Ht 5\' 5"  (1.651 m)   Wt 102.8 kg (226 lb 10.1 oz)   SpO2 98%   BMI 37.71 kg/m   General exam: Pleasant, obese, elderly female resting quietly in no distress Respiratory system: Less labored today on 4L by [place], no longer tachypneic this morning. Bibasilar crackles that improve with several deep breaths. Cardiovascular system: Regular rate and mild sinus arrhythmia on monitor. Distant but no murmur, rub, or gallop. No JVD appreciated, trace LE edema. Gastrointestinal system: Abdomen soft, non-tender, non-distended, with normoactive bowel sounds. No organomegaly or masses felt. GU: Foley catheter in situ. Central nervous system: Alert and  oriented. No focal neurological deficits. Extremities: Warm, no deformities Skin: No rashes, lesions no ulcers Psychiatry: Judgement and insight appear normal. Mood & affect appropriate.   CBC:  Recent Labs Lab 02/12/17 0206 02/13/17 0221 02/14/17 0244 02/15/17 0238 02/16/17 0500  WBC 13.8* 12.8* 14.5* 10.5 7.0  NEUTROABS  --   --   --  6.8  --   HGB 11.2* 11.6* 11.9* 11.1* 11.9*  HCT 35.3* 36.6 37.2 34.9* 37.0  MCV 79.0 78.5 79.3 79.7 79.9  PLT 303 252 281 271 307   Basic Metabolic Panel:  Recent Labs Lab 02/13/17 0221 02/14/17 0244 02/15/17 0238 02/16/17 1449 02/17/17 0251  NA 134* 136 138 136 135  K 3.4* 3.8 3.9 4.0 3.6  CL 92* 92* 95* 93* 92*  CO2 36* 36* 33* 34* 35*  GLUCOSE 291* 97 146* 182* 167*  BUN 20 20 21* 22* 26*  CREATININE 1.65* 1.56* 1.55* 1.62* 1.66*  CALCIUM 8.8* 8.9 8.8* 9.0 8.7*  MG  --   --   --   --  1.5*   GFR: Estimated Creatinine Clearance: 28.4 mL/min (A) (by C-G formula based on SCr of 1.66 mg/dL (H)).  Cardiac Enzymes:  Recent Labs Lab 02/10/17 1814 02/11/17 0122 02/11/17 0728  TROPONINI 4.66* 7.88* 6.06*   CBG:  Recent Labs Lab 02/16/17 0744 02/16/17 1248 02/16/17 1629 02/16/17 2116 02/17/17 0752  GLUCAP 174* 179*  166* 200* 146*   RVP negative Blood cultures negative  Time spent: 25 minutes.  Hazeline Junker, MD [facility] Pager [phone]

## 2017-02-17 NOTE — Evaluation (Addendum)
3Physical Therapy Evaluation Patient Details Name: Meagan HartMary F Scroggins MRN: [id number] DOB: 03/29/1927 Today's Date: 02/17/2017   History of Present Illness  Patient is a 83 y/o female who was [facility]/28 for acute hypoxic respiratory failure and sepsis secondary to pneumonia, [facility] due to new left BBB and elevated troponin. Cardiac cath  7/11 showed calcified 3 vessel CAD without PCI options being treated medically. CXR- CHF with edema. Admitted for NSTEMI. PMH includes asthma, CHF, CKD, DM, HTN, CAD, and stroke.  Clinical Impression  Patient presents with generalized weakness, deconditioning, dyspnea on exertion and impaired mobility s/p above. Tolerated gait training with Min A for balance/safety. Demonstrates decreased endurance and activity tolerance. Sp02 stayed >90% on 4L/min 02, required cues for pursed lip breathing. Pt lives with daughter and mobile at home with RW. Would benefit from ST SNF to maximize independence and mobility prior to return home. Will follow acutely.    Follow Up Recommendations SNF;Supervision for mobility/OOB;Supervision/Assistance - 24 hour    Equipment Recommendations  None recommended by PT    Recommendations for Other Services       Precautions / Restrictions Precautions Precautions: Fall Precaution Comments: watch 02 Restrictions Weight Bearing Restrictions: No      Mobility  Bed Mobility Overal bed mobility: Needs Assistance Bed Mobility: Supine to Sit;Rolling Rolling: Min assist   Supine to sit: Min assist;HOB elevated     General bed mobility comments: Increased time to get to EOB and Min A to elevate trunk. No dizziness reported. Rolling to right/left to remove bed pan- use of rail to assist.   Transfers Overall transfer level: Needs assistance Equipment used: Rolling walker (2 wheeled) Transfers: Sit to/from Stand Sit to Stand: Min assist         General transfer comment: Assist to power to standing  with cues for hand placement/technique; stood from EOB x1, from w/c x1. SPT w/c to w/c to transfer to new floor.  Ambulation/Gait Ambulation/Gait assistance: Min assist;+2 safety/equipment Ambulation Distance (Feet): 50 Feet Assistive device: Rolling walker (2 wheeled) Gait Pattern/deviations: Step-through pattern;Decreased stride length;Trunk flexed;Wide base of support Gait velocity: decreased Gait velocity interpretation: <1.8 ft/sec, indicative of risk for recurrent falls General Gait Details: Slow, mildly unsteady gait with 3/4 DOE. Sp02 stayed ~90% on 4L/min 02. Other VSS. Bil knee instability noted but no buckling.   Stairs            Wheelchair Mobility    Modified Rankin (Stroke Patients Only)       Balance Overall balance assessment: Needs assistance Sitting-balance support: Feet supported;Single extremity supported Sitting balance-Leahy Scale: Fair     Standing balance support: During functional activity;Bilateral upper extremity supported Standing balance-Leahy Scale: Poor Standing balance comment: Reliant on BUEs for support in standing.                              Pertinent Vitals/Pain Pain Assessment: No/denies pain    Home Living Family/patient expects to be discharged to:: Skilled nursing facility Living Arrangements: Children Available Help at Discharge: Family;Available 24 hours/day Type of Home: House Home Access: Level entry     Home Layout: One level Home Equipment: Walker - 4 wheels;Cane - single point;Bedside commode;Shower seat;Wheelchair - manual;Grab bars - tub/shower      Prior Function Level of Independence: Needs assistance   Gait / Transfers Assistance Needed: Uses SPC vs rollator for ambulation.   ADL's / Homemaking Assistance Needed: Has  an aide come in to assist with some IADLs and ADLs as needed. Pt able to dress self.  Comments: No falls reported.      Hand Dominance        Extremity/Trunk Assessment    Upper Extremity Assessment Upper Extremity Assessment: Defer to OT evaluation    Lower Extremity Assessment Lower Extremity Assessment: Generalized weakness       Communication   Communication: No difficulties  Cognition Arousal/Alertness: Awake/alert Behavior During Therapy: WFL for tasks assessed/performed Overall Cognitive Status: Within Functional Limits for tasks assessed                                 General Comments: for basic tasks but does not know date. Per daughter, pt masks symptoms and will not be forthcoming with them.      General Comments General comments (skin integrity, edema, etc.): Daughter present during session. Very anxious about mother's mobility.    Exercises     Assessment/Plan    PT Assessment Patient needs continued PT services  PT Problem List Decreased strength;Decreased mobility;Decreased balance;Decreased activity tolerance;Cardiopulmonary status limiting activity       PT Treatment Interventions Therapeutic activities;Gait training;Therapeutic exercise;Patient/family education;Balance training;Functional mobility training    PT Goals (Current goals can be found in the Care Plan section)  Acute Rehab PT Goals Patient Stated Goal: to get out of this bed! PT Goal Formulation: With patient Time For Goal Achievement: 03/03/17 Potential to Achieve Goals: Good    Frequency Min 2X/week   Barriers to discharge        Co-evaluation PT/OT/SLP Co-Evaluation/Treatment: Yes Reason for Co-Treatment: For patient/therapist safety;To address functional/ADL transfers PT goals addressed during session: Mobility/safety with mobility;Balance         AM-PAC PT "6 Clicks" Daily Activity  Outcome Measure Difficulty turning over in bed (including adjusting bedclothes, sheets and blankets)?: None Difficulty moving from lying on back to sitting on the side of the bed? : Total Difficulty sitting down on and standing up from a chair with  arms (e.g., wheelchair, bedside commode, etc,.)?: Total Help needed moving to and from a bed to chair (including a wheelchair)?: A Little Help needed walking in hospital room?: A Little Help needed climbing 3-5 steps with a railing? : Total 6 Click Score: 13    End of Session Equipment Utilized During Treatment: Gait belt;Oxygen Activity Tolerance: Patient limited by fatigue;Patient tolerated treatment well Patient left: in chair;with call bell/phone within reach;with family/visitor present;Other (comment) (in w/c with RN and daughter ready to be tx to new floor) Nurse Communication: Mobility status PT Visit Diagnosis: Unsteadiness on feet (R26.81);Muscle weakness (generalized) (M62.81);Other abnormalities of gait and mobility (R26.89)    Time: 9604-5409 PT Time Calculation (min) (ACUTE ONLY): 29 min   Charges:   PT Evaluation $PT Eval Moderate Complexity: 1 Procedure     PT G Codes:        Mylo Red, PT, DPT [phone]    Blake Divine A Kyon Bentler 02/17/2017, 2:06 PM

## 2017-02-17 NOTE — Progress Notes (Signed)
Pt transferred to 3W06 via wheelchair accompanied by RN and pt's daughter. Pt on the monitor and on  4 lpm oxygen via nasal cannula. Transferred to recliner chair with 2 person assist. Tolerated well. No s/s of distress at this time. Stephanie RN at bedside to receive pt.

## 2017-02-18 DIAGNOSIS — N179 Acute kidney failure, unspecified: Secondary | ICD-10-CM

## 2017-02-18 DIAGNOSIS — N183 Chronic kidney disease, stage 3 (moderate): Secondary | ICD-10-CM

## 2017-02-18 LAB — GLUCOSE, CAPILLARY
GLUCOSE-CAPILLARY: 149 mg/dL — AB (ref 65–99)
GLUCOSE-CAPILLARY: 214 mg/dL — AB (ref 65–99)
Glucose-Capillary: 180 mg/dL — ABNORMAL HIGH (ref 65–99)
Glucose-Capillary: 148 mg/dL — ABNORMAL HIGH (ref 65–99)

## 2017-02-18 LAB — HEMOGLOBIN A1C
HEMOGLOBIN A1C: 8.4 % — AB (ref 4.8–5.6)
MEAN PLASMA GLUCOSE: 194 mg/dL

## 2017-02-18 LAB — BASIC METABOLIC PANEL
Anion gap: 8 (ref 5–15)
BUN: 31 mg/dL — ABNORMAL HIGH (ref 6–20)
CHLORIDE: 92 mmol/L — AB (ref 101–111)
CO2: 34 mmol/L — ABNORMAL HIGH (ref 22–32)
CREATININE: 2.15 mg/dL — AB (ref 0.44–1.00)
Calcium: 8.8 mg/dL — ABNORMAL LOW (ref 8.9–10.3)
GFR, EST AFRICAN AMERICAN: 23 mL/min — AB (ref >60)
GFR, EST NON AFRICAN AMERICAN: 19 mL/min — AB (ref >60)
Glucose, Bld: 125 mg/dL — ABNORMAL HIGH (ref 65–99)
Potassium: 3.9 mmol/L (ref 3.5–5.1)
SODIUM: 134 mmol/L — AB (ref 135–145)

## 2017-02-18 MED ORDER — SENNOSIDES-DOCUSATE SODIUM 8.6-50 MG PO TABS
1.0000 | ORAL_TABLET | Freq: Every day | ORAL | Status: DC | PRN
  Administered 2017-02-18: 1 via ORAL
  Filled 2017-02-18 (×2): qty 1

## 2017-02-18 MED ORDER — POLYETHYLENE GLYCOL 3350 17 G PO PACK
17.0000 g | PACK | Freq: Every day | ORAL | Status: DC
  Administered 2017-02-18 – 2017-02-20 (×2): 17 g via ORAL
  Filled 2017-02-18 (×3): qty 1

## 2017-02-18 MED ORDER — INSULIN ASPART 100 UNIT/ML ~~LOC~~ SOLN
2.0000 [IU] | Freq: Three times a day (TID) | SUBCUTANEOUS | Status: DC
  Administered 2017-02-18 – 2017-02-20 (×6): 2 [IU] via SUBCUTANEOUS

## 2017-02-18 MED ORDER — TRAZODONE HCL 50 MG PO TABS
50.0000 mg | ORAL_TABLET | Freq: Every evening | ORAL | Status: DC | PRN
  Administered 2017-02-18: 50 mg via ORAL
  Filled 2017-02-18: qty 1

## 2017-02-18 MED ORDER — SENNA 8.6 MG PO TABS
1.0000 | ORAL_TABLET | Freq: Every day | ORAL | Status: DC
  Administered 2017-02-19 – 2017-02-20 (×2): 8.6 mg via ORAL
  Filled 2017-02-18 (×2): qty 1

## 2017-02-18 NOTE — Progress Notes (Signed)
PROGRESS NOTE  Meagan Roberts  UEA:[id number] DOB: Oct 15, 1926 DOA: 02/10/2017 PCP: Olive Bass, MD   Brief Narrative: 83 y.o.femalewith a history of asthma, CHF, CKD, DM, HTN, CAD, and stroke who presented to [facility] in transfer from [facility] where she was admitted from 06/28 for acute hypoxic respiratory failure and sepsis secondary to pneumonia. During her stay at [facility] was treated for pneumonia but noted to have a troponin of 0.99 and a new left bundle branch block. She was transferred to [facility] for a cardiology evaluation. At time of her arrival patient she was placed in the ICU on BiPAP. Diuresis was guided by cardiology. Echocardiogram showed EF 40-45% with mid-apical septal hypokinesis and mid-apical inferior hypokinesis. Cardiac catheterization 7/11 showed calcified 3 vessel CAD without PCI options. Medical management recommended.   Subjective: Dyspnea is stable, feels very tired. Minimizes her shortness of breath, denies chest pain. Much less urine output in past 24 hours. Foley out.  Assessment & Plan: Active Problems:   Elevated troponin   Pressure injury of skin   Acute respiratory failure with hypoxia (HCC)   Dyspnea   Acute pulmonary edema (HCC)   Hyperglycemia   HCAP (healthcare-associated pneumonia)   Acute diastolic heart failure (HCC)   Non-ST elevation (NSTEMI) myocardial infarction [facility])   Coronary artery disease involving native coronary artery of native heart with unstable angina pectoris (HCC)   Acute combined systolic and diastolic congestive heart failure (HCC)   Acute on chronic respiratory failure with hypoxia (HCC)   Diabetes mellitus with complication (HCC)   Essential hypertension  Acute systolic CHF: EF 47-82% with regional hypokinesis on echocardiogram earlier this admission. - Continue diuresis per cardiology: Switch to oral 7/13 subsequently with both decreased UOP and worsening renal impairment. - Continue ARB, carvedilol.    - Strict I/O, daily weights.   Acute kidney injury on stage III CKD: Limited data to establish baseline Cr. Was 1.2 in 2016, 1.47 on admission. Up to 2.15 without hyperkalemia or acidosis 7/14.  - Nephrology consulted.  - Measure postvoid residual to r/o retention. UOP declined after foley removed.    T2DM: HbA1c 8.4% indicating average CBG of 195mg /dl. CBGs at inpatient goal x48 hours, suspect infection-related hyperglycemia initially.  - Made incremental increase to lantus 20u > 25u daily (50u daily at home) based on elevated fasting CBG + continue resistant SSI (15, 15, 20u w/meals at home), as she is not eating as carb-rich diet as she was at home. - Using more long acting insulin than short, so will add meal coverage 2u TID. Goal is < 180mg /dl.   Acute on chronic hypoxic resp failure: Initially due to pneumonia, though this no longer appears to be the issue. Afebrile, no leukocytosis, and PCT not suggestive of bacterial pneumonia. RVP negative.  - Currently requiring 4L, up from home baseline of 2L by [place]. Wean as tolerated with treatment of conditions as below. - Incentive spirometry encouraged as she is at significant risk for atelectasis. OOB as able.   NSTEMI due to CAD w/ remote PCI, 3 vessel severely calcified CAD confirmed on [facility] 7/11. No further chest pain at this time.  - Medical management was recommended: Continue aspirin, plavix, coreg, imdur. No statin (atorvastatin allergy noted) per cardiology.   Hyperlipidemia:  - Consider lipid clinic follow up for PCSK9.   HTN: Chronic, stable. Good control currently.  - Continue medications as above and hydralazine.  - Imdur added 7/12.   Stage II sacral decubitus ulcer:  -  WOC has seen the pt: Continue wound care by RN, duoderm, offloading as able  Obesity - Body mass index is 39.36 kg/m. - PT evaluation ordered  DVT prophylaxis: Subcutaneous heparin Code Status: Full Family Communication: None at bedside Disposition  Plan: Per primary. SNF has been recommended by PT/OT.   Procedures:  Left Heart Cath and Coronary Angiography     Mid LAD lesion, 70 %stenosed.  Ost 1st Diag to 1st Diag lesion, 95 %stenosed.  Prox RCA lesion, 90 %stenosed.  Mid RCA lesion, 100 %stenosed.  Prox Cx lesion, 80 %stenosed.   1. Significant heavily calcified 3 vessel coronary artery disease with chronically occluded right coronary artery with left-to-right collaterals, small left circumflex with diffuse proximal disease and borderline significant mid LAD disease which appears to be a complex plaque with the appearance of double-lumen. 2. Severely elevated left ventricular end-diastolic pressure at 36 mmHg.  Recommendations: No straightforward PCI options given diffusely diseased and heavily calcified vessels. Recommend medical therapy for coronary artery disease. The patient's bigger issue seems to be heart failure with severely elevated left ventricular end-diastolic pressure. Continue aggressive diuresis. The procedure was very difficult as the patient was very orthopneic and could not hold still for the procedure. I gave her one dose of IV furosemide at the end of the case and placed a Foley catheter for aggressive diuresis.   Objective: BP 125/65   Pulse 89   Temp 98.8 F (37.1 C) (Oral)   Resp 19   Ht 5\' 5"  (1.651 m)   Wt 103.3 kg (227 lb 12.8 oz)   SpO2 92%   BMI 37.91 kg/m   General exam: Pleasant, obese, elderly female resting quietly in no distress Respiratory system: Less labored today on 4L by [place], no longer tachypneic this morning. Bibasilar crackles that improve with several deep breaths. Cardiovascular system: Regular rate and mild sinus arrhythmia on monitor. Distant but no murmur, rub, or gallop. No JVD appreciated, trace LE edema. Gastrointestinal system: Abdomen soft, non-tender, non-distended, with normoactive bowel sounds. No organomegaly or masses felt. GU: Foley catheter in situ. Central  nervous system: Alert and oriented. No focal neurological deficits. Extremities: Warm, no deformities Skin: No rashes, lesions no ulcers Psychiatry: Judgement and insight appear normal. Mood & affect appropriate.   CBC:  Recent Labs Lab 02/12/17 0206 02/13/17 0221 02/14/17 0244 02/15/17 0238 02/16/17 0500  WBC 13.8* 12.8* 14.5* 10.5 7.0  NEUTROABS  --   --   --  6.8  --   HGB 11.2* 11.6* 11.9* 11.1* 11.9*  HCT 35.3* 36.6 37.2 34.9* 37.0  MCV 79.0 78.5 79.3 79.7 79.9  PLT 303 252 281 271 307   Basic Metabolic Panel:  Recent Labs Lab 02/14/17 0244 02/15/17 0238 02/16/17 1449 02/17/17 0251 02/18/17 0229  NA 136 138 136 135 134*  K 3.8 3.9 4.0 3.6 3.9  CL 92* 95* 93* 92* 92*  CO2 36* 33* 34* 35* 34*  GLUCOSE 97 146* 182* 167* 125*  BUN 20 21* 22* 26* 31*  CREATININE 1.56* 1.55* 1.62* 1.66* 2.15*  CALCIUM 8.9 8.8* 9.0 8.7* 8.8*  MG  --   --   --  1.5*  --    GFR: Estimated Creatinine Clearance: 22 mL/min (A) (by C-G formula based on SCr of 2.15 mg/dL (H)).  CBG:  Recent Labs Lab 02/17/17 1131 02/17/17 1625 02/17/17 2058 02/18/17 0744 02/18/17 1119  GLUCAP 193* 231* 170* 149* 214*   RVP negative Blood cultures negative  Time spent: 25 minutes.  Vance Gather, MD [facility] Pager [phone]

## 2017-02-18 NOTE — Progress Notes (Signed)
Progress Note  Patient Name: Meagan Roberts Date of Encounter: 02/18/2017  Primary Cardiologist: Nash Bolls (new)  Subjective   Denies angina or dyspnea, bu appears slightly tachypneic sitting up in chair. No net diuresis last 24 h, but renal function has deteriorated and reportedly only 500 L UO last 24h.  Inpatient Medications    Scheduled Meds: . amLODipine  10 mg Oral Daily  . aspirin EC  81 mg Oral Daily  . carvedilol  12.5 mg Oral BID WC  . chlorhexidine  15 mL Mouth Rinse BID  . clopidogrel  75 mg Oral Daily  . colchicine  0.6 mg Oral Daily  . feeding supplement (ENSURE ENLIVE)  237 mL Oral BID BM  . feeding supplement (PRO-STAT SUGAR FREE 64)  30 mL Oral BID  . furosemide  80 mg Oral BID  . gabapentin  300 mg Oral QHS  . heparin  5,000 Units Subcutaneous Q8H  . hydrALAZINE  25 mg Oral Q6H  . insulin aspart  0-20 Units Subcutaneous TID WC  . insulin aspart  0-5 Units Subcutaneous QHS  . insulin glargine  25 Units Subcutaneous Daily  . irbesartan  300 mg Oral Daily  . isosorbide mononitrate  30 mg Oral Daily  . mouth rinse  15 mL Mouth Rinse q12n4p  . pantoprazole  40 mg Oral BID  . prednisoLONE acetate  1 drop Right Eye BID  . sodium chloride flush  3 mL Intravenous Q12H   Continuous Infusions: . sodium chloride Stopped (02/16/17 0000)   PRN Meds: sodium chloride, acetaminophen, ALPRAZolam, alum & mag hydroxide-simeth, nitroGLYCERIN, ondansetron (ZOFRAN) IV, sodium chloride flush   Vital Signs    Vitals:   02/17/17 2016 02/17/17 2047 02/18/17 0600 02/18/17 0817  BP:  (!) 142/72 (!) 141/75 125/65  Pulse: 74 81 79 89  Resp: 18 17 16 19   Temp:  98.1 F (36.7 C) 98.8 F (37.1 C)   TempSrc:  Oral Oral   SpO2: 93% 93% 96% 92%  Weight:   227 lb 12.8 oz (103.3 kg)   Height:        Intake/Output Summary (Last 24 hours) at 02/18/17 0940 Last data filed at 02/18/17 0853  Gross per 24 hour  Intake             1203 ml  Output              380 ml  Net               823 ml   Filed Weights   02/16/17 0600 02/17/17 0600 02/18/17 0600  Weight: 235 lb 10.8 oz (106.9 kg) 226 lb 10.1 oz (102.8 kg) 227 lb 12.8 oz (103.3 kg)    Telemetry    NSR - Personally Reviewed  ECG    No new tracing - Personally Reviewed  Physical Exam  Smiling and comfortable, but appears a little tachypneic GEN: No acute distress.  Opacified right cornea Neck:  6-7 cm JVD Cardiac: RRR, no murmurs, rubs, or gallops.  Respiratory: Clear to auscultation bilaterally. GI: Soft, nontender, non-distended  MS: No edema; No deformity. Neuro:  Nonfocal  Psych: Normal affect   Labs    Chemistry Recent Labs Lab 02/16/17 1449 02/17/17 0251 02/18/17 0229  NA 136 135 134*  K 4.0 3.6 3.9  CL 93* 92* 92*  CO2 34* 35* 34*  GLUCOSE 182* 167* 125*  BUN 22* 26* 31*  CREATININE 1.62* 1.66* 2.15*  CALCIUM 9.0 8.7* 8.8*  GFRNONAA 27*  27* 19*  GFRAA 32* 31* 23*  ANIONGAP 9 8 8      Hematology Recent Labs Lab 02/14/17 0244 02/15/17 0238 02/16/17 0500  WBC 14.5* 10.5 7.0  RBC 4.69 4.38 4.63  HGB 11.9* 11.1* 11.9*  HCT 37.2 34.9* 37.0  MCV 79.3 79.7 79.9  MCH 25.4* 25.3* 25.7*  MCHC 32.0 31.8 32.2  RDW 16.8* 16.9* 17.3*  PLT 281 271 307    Cardiac EnzymesNo results for input(s): TROPONINI in the last 168 hours. No results for input(s): TROPIPOC in the last 168 hours.   BNP Recent Labs Lab 02/13/17 0910  BNP 219.7*     DDimer No results for input(s): DDIMER in the last 168 hours.   Radiology    No results found.  Cardiac Studies   Cardiac Cath 02/15/2017 Conclusion     Mid LAD lesion, 70 %stenosed.  Ost 1st Diag to 1st Diag lesion, 95 %stenosed.  Prox RCA lesion, 90 %stenosed.  Mid RCA lesion, 100 %stenosed.  Prox Cx lesion, 80 %stenosed.  1. Significant heavily calcified 3 vessel coronary artery disease with chronically occluded right coronary artery with left-to-right collaterals, small left circumflex with diffuse proximal disease and  borderline significant mid LAD disease which appears to be a complex plaque with the appearance of double-lumen. 2. Severely elevated left ventricular end-diastolic pressure at 36 mmHg.     Patient Profile     83 y.o. female with severe three-vessel coronary artery disease, not amenable to PCI, not a good candidate for bypass surgery due to age and comorbid conditions, acute on chronic heart failure management difficult due to worsening renal function  Assessment & Plan    1.Acute on chronic combined systolic and diastolic heart failure: Net diuresis 9 L since admission, roughly 3.5 L since cardiac catheterization when she had severely elevated filling pressures. Diuretics switched to oral furosemide yesterday. Since then urine output has dropped drastically. Clinical exam still suggests mild hypervolemia. On beta blockers and combined with hydralazine and nitrates as well as ARB. 2. CAD: Unfortunately her coronary anatomy is very complex and not amenable to percutaneous revascularization. She is a very poor candidate for bypass surgery due to renal failure, obesity, poor functional status and worsening renal function. Medical therapy. 3. Acute on chronic renal insufficiency: Likely background of diabetic nephropathy, now with prerenal as a tenia secondary to diuretics, but with substantial concern for possible contrast-induced nephrotoxicity, roughly 48-72 hours after heart catheterization. Stop ARB. Stop diuretics. Nephrology consultation. I doubt that she would be a great candidate for chronic hemodialysis. 4. HTN: Pressure is mostly in the desirable range 5. DM Type 2, insulin-requiring: Mediocre control, A1c 8.4% 6. Stage II sacral decubitus ulcer: Appropriate wound care has been initiated, reposition frequently 7. Pneumonia with respiratory failure and sepsis, resolved  Signed, Thurmon Fair, MD  02/18/2017, 9:40 AM

## 2017-02-19 DIAGNOSIS — I25110 Atherosclerotic heart disease of native coronary artery with unstable angina pectoris: Secondary | ICD-10-CM

## 2017-02-19 LAB — BASIC METABOLIC PANEL
Anion gap: 6 (ref 5–15)
BUN: 32 mg/dL — AB (ref 6–20)
CALCIUM: 8.7 mg/dL — AB (ref 8.9–10.3)
CO2: 37 mmol/L — ABNORMAL HIGH (ref 22–32)
Chloride: 91 mmol/L — ABNORMAL LOW (ref 101–111)
Creatinine, Ser: 2.02 mg/dL — ABNORMAL HIGH (ref 0.44–1.00)
GFR calc Af Amer: 24 mL/min — ABNORMAL LOW (ref >60)
GFR, EST NON AFRICAN AMERICAN: 21 mL/min — AB (ref >60)
GLUCOSE: 94 mg/dL (ref 65–99)
POTASSIUM: 3.9 mmol/L (ref 3.5–5.1)
Sodium: 134 mmol/L — ABNORMAL LOW (ref 135–145)

## 2017-02-19 LAB — GLUCOSE, CAPILLARY
GLUCOSE-CAPILLARY: 126 mg/dL — AB (ref 65–99)
GLUCOSE-CAPILLARY: 114 mg/dL — AB (ref 65–99)
Glucose-Capillary: 215 mg/dL — ABNORMAL HIGH (ref 65–99)
Glucose-Capillary: 112 mg/dL — ABNORMAL HIGH (ref 65–99)

## 2017-02-19 MED ORDER — FUROSEMIDE 80 MG PO TABS
80.0000 mg | ORAL_TABLET | Freq: Every day | ORAL | Status: DC
  Administered 2017-02-19 – 2017-02-20 (×2): 80 mg via ORAL
  Filled 2017-02-19 (×2): qty 1

## 2017-02-19 MED ORDER — ISOSORBIDE MONONITRATE ER 60 MG PO TB24
60.0000 mg | ORAL_TABLET | Freq: Every day | ORAL | Status: DC
  Administered 2017-02-19 – 2017-02-20 (×2): 60 mg via ORAL
  Filled 2017-02-19 (×2): qty 1

## 2017-02-19 MED ORDER — HYDRALAZINE HCL 25 MG PO TABS
37.5000 mg | ORAL_TABLET | Freq: Three times a day (TID) | ORAL | Status: DC
  Administered 2017-02-19 – 2017-02-20 (×4): 37.5 mg via ORAL
  Filled 2017-02-19 (×4): qty 2

## 2017-02-19 NOTE — Progress Notes (Addendum)
Progress Note  Patient Name: Meagan Roberts Date of Encounter: 02/19/2017  Primary Cardiologist: New (Meghan Tiemann)  Subjective   Feels markedly better today. Improved urine output and improving creatinine.  Inpatient Medications    Scheduled Meds: . amLODipine  10 mg Oral Daily  . aspirin EC  81 mg Oral Daily  . carvedilol  12.5 mg Oral BID WC  . chlorhexidine  15 mL Mouth Rinse BID  . clopidogrel  75 mg Oral Daily  . colchicine  0.6 mg Oral Daily  . feeding supplement (ENSURE ENLIVE)  237 mL Oral BID BM  . feeding supplement (PRO-STAT SUGAR FREE 64)  30 mL Oral BID  . gabapentin  300 mg Oral QHS  . heparin  5,000 Units Subcutaneous Q8H  . hydrALAZINE  25 mg Oral Q6H  . insulin aspart  0-20 Units Subcutaneous TID WC  . insulin aspart  0-5 Units Subcutaneous QHS  . insulin aspart  2 Units Subcutaneous TID WC  . insulin glargine  25 Units Subcutaneous Daily  . isosorbide mononitrate  30 mg Oral Daily  . mouth rinse  15 mL Mouth Rinse q12n4p  . pantoprazole  40 mg Oral BID  . polyethylene glycol  17 g Oral Daily  . prednisoLONE acetate  1 drop Right Eye BID  . senna  1 tablet Oral Daily  . sodium chloride flush  3 mL Intravenous Q12H   Continuous Infusions: . sodium chloride Stopped (02/16/17 0000)   PRN Meds: sodium chloride, acetaminophen, ALPRAZolam, alum & mag hydroxide-simeth, nitroGLYCERIN, ondansetron (ZOFRAN) IV, senna-docusate, sodium chloride flush, traZODone   Vital Signs    Vitals:   02/18/17 1000 02/18/17 1506 02/18/17 1915 02/19/17 0630  BP:  (!) 108/56 134/68 134/80  Pulse: 77  82 82  Resp: 14  (!) 24 (!) 24  Temp:  98.6 F (37 C) 98.7 F (37.1 C) 98.5 F (36.9 C)  TempSrc:  Oral Oral Oral  SpO2: 92% 93% 93% 92%  Weight:    229 lb 9.6 oz (104.1 kg)  Height:        Intake/Output Summary (Last 24 hours) at 02/19/17 0751 Last data filed at 02/19/17 0630  Gross per 24 hour  Intake             1080 ml  Output             2000 ml  Net              -920 ml   Filed Weights   02/17/17 0600 02/18/17 0600 02/19/17 0630  Weight: 226 lb 10.1 oz (102.8 kg) 227 lb 12.8 oz (103.3 kg) 229 lb 9.6 oz (104.1 kg)    Telemetry    NSR w PVCs - Personally Reviewed  ECG    No new ECG - Personally Reviewed  Physical Exam  Smiling. Obesity limits the exam GEN: No acute distress.   Neck: 5-6 cm JVD Cardiac: RRR, no murmurs, rubs, or gallops. Paradoxically split S2 Respiratory: Clear to auscultation bilaterally. GI: Soft, nontender, non-distended  MS: No edema; No deformity. Neuro:  Nonfocal  Psych: Normal affect   Labs    Chemistry Recent Labs Lab 02/17/17 0251 02/18/17 0229 02/19/17 0220  NA 135 134* 134*  K 3.6 3.9 3.9  CL 92* 92* 91*  CO2 35* 34* 37*  GLUCOSE 167* 125* 94  BUN 26* 31* 32*  CREATININE 1.66* 2.15* 2.02*  CALCIUM 8.7* 8.8* 8.7*  GFRNONAA 27* 19* 21*  GFRAA 31* 23* 24*  ANIONGAP 8 8 6      Hematology Recent Labs Lab 02/14/17 0244 02/15/17 0238 02/16/17 0500  WBC 14.5* 10.5 7.0  RBC 4.69 4.38 4.63  HGB 11.9* 11.1* 11.9*  HCT 37.2 34.9* 37.0  MCV 79.3 79.7 79.9  MCH 25.4* 25.3* 25.7*  MCHC 32.0 31.8 32.2  RDW 16.8* 16.9* 17.3*  PLT 281 271 307   BNP Recent Labs Lab 02/13/17 0910  BNP 219.7*    Cardiac Studies   Cardiac Cath 02/15/2017 Conclusion     Mid LAD lesion, 70 %stenosed.  Ost 1st Diag to 1st Diag lesion, 95 %stenosed.  Prox RCA lesion, 90 %stenosed.  Mid RCA lesion, 100 %stenosed.  Prox Cx lesion, 80 %stenosed.  1. Significant heavily calcified 3 vessel coronary artery disease with chronically occluded right coronary artery with left-to-right collaterals, small left circumflex with diffuse proximal disease and borderline significant mid LAD disease which appears to be a complex plaque with the appearance of double-lumen. 2. Severely elevated left ventricular end-diastolic pressure at 36 mmHg.     Patient Profile     83 y.o. female presenting with acute respiratory  failure, new LBBB and small NSTEMI, severe three-vessel coronary artery disease, not amenable to PCI, not a good candidate for bypass surgery due to age and comorbid conditions, acute on chronic heart failure management difficult due to worsening renal function, possible transient contrast induced nephrotoxicity  Assessment & Plan    1.Acute on chronic combined systolic and diastolic heart failure: Net diuresis 10 L since admission, roughly 4.5 L since cardiac catheterization when she had severely elevated filling pressures. Resume oral diuretics, as renal function has improved. On beta blockers and combined with hydralazine and nitrates. At least in near future avoid ARB. 2. CAD: Unfortunately her coronary anatomy is very complex and not amenable to percutaneous revascularization. She is a very poor candidate for bypass surgery due to renal failure, obesity, poor functional status and worsening renal function. Medical therapy. 3. Acute on chronic renal insufficiency: Improving. Keep off ARB. Restart oral diuretics. Optimize hydralazine/isosorbide. Not a great candidate for chronic hemodialysis. 4. HTN: Pressure is mostly in the desirable range 5. DM Type 2, insulin-requiring: Mediocre control, A1c 8.4% 6. Stage II sacral decubitus ulcer: Appropriate wound care has been initiated, reposition frequently 7. Pneumonia with respiratory failure and sepsis, resolved  Needs PT evaluation and may need SNF due to poor functional status and deconditioning.  Signed, Thurmon FairMihai Brenon Antosh, MD  02/19/2017, 7:51 AM

## 2017-02-19 NOTE — Progress Notes (Signed)
Pharmacist Heart Failure Core Measure Documentation  Assessment: Meagan Roberts has an EF documented as 40% by ECHO  Rationale: Heart failure patients with left ventricular systolic dysfunction (LVSD) and an EF < 40% should be prescribed an angiotensin converting enzyme inhibitor (ACEI) or angiotensin receptor blocker (ARB) at discharge unless a contraindication is documented in the medical record.  This patient is not currently on an ACEI or ARB for HF.  This note is being placed in the record in order to provide documentation that a contraindication to the use of these agents is present for this encounter.  ACE Inhibitor or Angiotensin Receptor Blocker is contraindicated (specify all that apply)  []   ACEI allergy AND ARB allergy []   Angioedema []   Moderate or severe aortic stenosis []   Hyperkalemia []   Hypotension []   Renal artery stenosis [x]   Worsening renal function, preexisting renal disease or dysfunction  Leota SauersLisa Santhiago Collingsworth Pharm.D. CPP, BCPS Clinical Pharmacist [phone] 02/19/2017 4:08 PM

## 2017-02-19 NOTE — Progress Notes (Signed)
PROGRESS NOTE  Meagan Roberts  ZOX:[id number] DOB: 04/10/1927 DOA: 02/10/2017 PCP: Olive Bassough, Robert L, MD   Brief Narrative: 83 y.o.femalewith a history of asthma, CHF, CKD, DM, HTN, CAD, and stroke who presented to [facility] she was admitted from 06/28 for acute hypoxic respiratory failure and sepsis secondary to pneumonia. [facility] but noted to have a troponin of 0.99 and a new left bundle branch block. She was transferred to [facility] for a cardiology evaluation. At time of her arrival patient she was placed in the ICU on BiPAP. Diuresis was guided by cardiology. Echocardiogram showed EF 40-45% with mid-apical septal hypokinesis and mid-apical inferior hypokinesis. Cardiac catheterization 7/11 showed calcified 3 vessel CAD without PCI options. Medical management recommended.   Subjective: Reporting improvement in shortness of breath from yesterday. No chest pain, fever, chills, cough, dysuria.  Assessment & Plan:  Hospitalists will sign off with recommendations for diabetes and wound care as below. Please call if we can be of any assistance.   T2DM: HbA1c 8.4% indicating average CBG of 195mg /dl. Suspect infection-related hyperglycemia initially.  - Made incremental increase to lantus 20u > 25u daily (50u daily at home) and 2u meal coverage + resistant SSI TIDAC (15, 15, 20u w/meals at home) with adequate control. Would continue this at discharge with plans to titrate depending on her dietary intake as outpatient.   Stage II sacral decubitus ulcer:  - WOC has seen the pt: Continue wound care by RN, foam padding, turn q2h, offloading as able.  - Optimize nutritional status.  Acute systolic CHF: EF 81-19%40-45% with regional hypokinesis on echocardiogram earlier this admission. - Continue diuresis per cardiology: Switch to oral 7/13 subsequently with both decreased UOP and worsening renal impairment. - Continue  ARB, carvedilol.  - Strict I/O, daily weights.   Acute kidney injury on stage III CKD: Limited data to establish baseline Cr. Was 1.2 in 2016, 1.47 on admission. Up to 2.15 without hyperkalemia or acidosis 7/14, improved when lasix withheld - Nephrology consult considered per primary.  Acute on chronic hypoxic resp failure: Initially due to pneumonia, though this no longer appears to be the issue. Afebrile, no leukocytosis, and PCT not suggestive of bacterial pneumonia. RVP negative.  - Currently requiring 4L, up from home baseline of 2L by [place].  - Incentive spirometry encouraged as she is at significant risk for atelectasis. OOB as able.   NSTEMI due to CAD w/ remote PCI, 3 vessel severely calc[facility]. No further chest pain at this time.  - Medical management was recommended: Continue aspirin, plavix, coreg, imdur. No statin (atorvastatin allergy noted) per cardiology.   Hyperlipidemia:  - Consider lipid clinic follow up for PCSK9.   HTN: Chronic, stable. Good control currently.  - Continue medications as above and hydralazine.  - Imdur added 7/12.   Obesity - Body mass index is 39.36 kg/m. - PT  DVT prophylaxis: Subcutaneous heparin Code Status: Full Family Communication: None at bedside Disposition Plan: Per primary, plan is SNF.   Procedures:  Left Heart Cath and Coronary Angiography     Mid LAD lesion, 70 %stenosed.  Ost 1st Diag to 1st Diag lesion, 95 %stenosed.  Prox RCA lesion, 90 %stenosed.  Mid RCA lesion, 100 %stenosed.  Prox Cx lesion, 80 %stenosed.   1. Significant heavily calcified 3 vessel coronary artery disease with chronically occluded right coronary artery with left-to-right collaterals, small left circumflex  with diffuse proximal disease and borderline significant mid LAD disease which appears to be a complex plaque with the appearance of double-lumen. 2. Severely elevated left ventricular end-diastolic pressure at 36  mmHg.  Recommendations: No straightforward PCI options given diffusely diseased and heavily calcified vessels. Recommend medical therapy for coronary artery disease. The patient's bigger issue seems to be heart failure with severely elevated left ventricular end-diastolic pressure. Continue aggressive diuresis. The procedure was very difficult as the patient was very orthopneic and could not hold still for the procedure. I gave her one dose of IV furosemide at the end of the case and placed a Foley catheter for aggressive diuresis.   Objective: BP 134/80 (BP Location: Left Arm)   Pulse 82   Temp 98.5 F (36.9 C) (Oral)   Resp (!) 24   Ht 5\' 5"  (1.651 m)   Wt 104.1 kg (229 lb 9.6 oz)   SpO2 92%   BMI 38.21 kg/m   General exam: Pleasant, obese, elderly female laying on left side in bed in no distress Respiratory system: Tachypneic on 4L by [place]. Bibasilar crackles that improve with several deep breaths. Cardiovascular system: Regular rate and mild sinus arrhythmia on monitor. Distant but no murmur. Difficult to assess JVP due to habitus Gastrointestinal system: Abdomen soft, non-tender, non-distended, with normoactive bowel sounds. No organomegaly or masses felt. GU: Foley catheter in situ. Central nervous system: Alert and oriented. No focal neurological deficits. Extremities: Warm, no deformities Skin: Small annular pressure injury on sacrum with minimal drainage, foam dressing c/d/i.  Psychiatry: Judgement and insight appear normal. Mood & affect appropriate.   CBC:  Recent Labs Lab 02/13/17 0221 02/14/17 0244 02/15/17 0238 02/16/17 0500  WBC 12.8* 14.5* 10.5 7.0  NEUTROABS  --   --  6.8  --   HGB 11.6* 11.9* 11.1* 11.9*  HCT 36.6 37.2 34.9* 37.0  MCV 78.5 79.3 79.7 79.9  PLT 252 281 271 307   Basic Metabolic Panel:  Recent Labs Lab 02/15/17 0238 02/16/17 1449 02/17/17 0251 02/18/17 0229 02/19/17 0220  NA 138 136 135 134* 134*  K 3.9 4.0 3.6 3.9 3.9  CL 95* 93*  92* 92* 91*  CO2 33* 34* 35* 34* 37*  GLUCOSE 146* 182* 167* 125* 94  BUN 21* 22* 26* 31* 32*  CREATININE 1.55* 1.62* 1.66* 2.15* 2.02*  CALCIUM 8.8* 9.0 8.7* 8.8* 8.7*  MG  --   --  1.5*  --   --    GFR: Estimated Creatinine Clearance: 23.5 mL/min (A) (by C-G formula based on SCr of 2.02 mg/dL (H)).  CBG:  Recent Labs Lab 02/18/17 1119 02/18/17 1629 02/18/17 2121 02/19/17 0727 02/19/17 1114  GLUCAP 214* 180* 148* 126* 215*   RVP negative Blood cultures negative  Time spent: 25 minutes.  Hazeline Junker, MD [facility] Pager [phone]

## 2017-02-20 LAB — BASIC METABOLIC PANEL
ANION GAP: 8 (ref 5–15)
BUN: 34 mg/dL — ABNORMAL HIGH (ref 6–20)
CHLORIDE: 92 mmol/L — AB (ref 101–111)
CO2: 35 mmol/L — ABNORMAL HIGH (ref 22–32)
Calcium: 8.8 mg/dL — ABNORMAL LOW (ref 8.9–10.3)
Creatinine, Ser: 2.09 mg/dL — ABNORMAL HIGH (ref 0.44–1.00)
GFR calc non Af Amer: 20 mL/min — ABNORMAL LOW (ref >60)
GFR, EST AFRICAN AMERICAN: 23 mL/min — AB (ref >60)
GLUCOSE: 96 mg/dL (ref 65–99)
Potassium: 3.9 mmol/L (ref 3.5–5.1)
Sodium: 135 mmol/L (ref 135–145)

## 2017-02-20 LAB — GLUCOSE, CAPILLARY
GLUCOSE-CAPILLARY: 180 mg/dL — AB (ref 65–99)
GLUCOSE-CAPILLARY: 160 mg/dL — AB (ref 65–99)
Glucose-Capillary: 141 mg/dL — ABNORMAL HIGH (ref 65–99)

## 2017-02-20 MED ORDER — ISOSORBIDE MONONITRATE ER 60 MG PO TB24: 60.0000 mg | ORAL_TABLET | Freq: Every day | ORAL | 11 refills | Status: DC

## 2017-02-20 MED ORDER — PRO-STAT SUGAR FREE PO LIQD: 30.0000 mL | Freq: Two times a day (BID) | ORAL | 0 refills | Status: DC

## 2017-02-20 MED ORDER — INSULIN GLARGINE 100 UNIT/ML ~~LOC~~ SOLN: 25.0000 [IU] | Freq: Every day | SUBCUTANEOUS | 11 refills | Status: AC

## 2017-02-20 MED ORDER — INSULIN ASPART 100 UNIT/ML ~~LOC~~ SOLN: 0.0000 [IU] | Freq: Three times a day (TID) | SUBCUTANEOUS | 11 refills | Status: DC

## 2017-02-20 MED ORDER — ASPIRIN 81 MG PO TBEC: 81.0000 mg | DELAYED_RELEASE_TABLET | Freq: Every day | ORAL | 11 refills | Status: AC

## 2017-02-20 MED ORDER — HYDRALAZINE HCL 25 MG PO TABS: 37.5000 mg | ORAL_TABLET | Freq: Three times a day (TID) | ORAL | 5 refills | Status: DC

## 2017-02-20 MED ORDER — INSULIN LISPRO 100 UNIT/ML (KWIKPEN): 2.0000 [IU] | PEN_INJECTOR | Freq: Three times a day (TID) | SUBCUTANEOUS | 0 refills | Status: DC

## 2017-02-20 MED ORDER — AMLODIPINE BESYLATE 10 MG PO TABS: 10.0000 mg | ORAL_TABLET | Freq: Every day | ORAL | 5 refills | Status: DC

## 2017-02-20 MED ORDER — CARVEDILOL 12.5 MG PO TABS: 12.5000 mg | ORAL_TABLET | Freq: Two times a day (BID) | ORAL | 5 refills | Status: DC

## 2017-02-20 MED ORDER — NITROGLYCERIN 0.4 MG SL SUBL: 0.4000 mg | SUBLINGUAL_TABLET | SUBLINGUAL | 6 refills | Status: DC | PRN

## 2017-02-20 MED ORDER — ENSURE ENLIVE PO LIQD: 237.0000 mL | Freq: Two times a day (BID) | ORAL | 12 refills | Status: DC

## 2017-02-20 MED ORDER — POLYETHYLENE GLYCOL 3350 17 G PO PACK: 17.0000 g | PACK | Freq: Every day | ORAL | 0 refills | Status: DC

## 2017-02-20 MED ORDER — FUROSEMIDE 80 MG PO TABS: 80.0000 mg | ORAL_TABLET | Freq: Every day | ORAL | 5 refills | Status: DC

## 2017-02-20 NOTE — Discharge Summary (Signed)
Discharge Summary    Patient ID: Meagan Roberts,  MRN: [id number], DOB/AGE: 12/01/1926 83 y.o.  Admit date: 02/10/2017 Discharge date: 02/20/2017  Primary Care Provider: Olive Roberts Primary Cardiologist: New- Dr. Royann Roberts  Discharge Diagnoses    Active Problems:   Elevated troponin   Pressure injury of skin   Acute respiratory failure with hypoxia (HCC)   Dyspnea   Acute pulmonary edema (HCC)   Hyperglycemia   HCAP (healthcare-associated pneumonia)   Acute diastolic heart failure (HCC)   Non-ST elevation (NSTEMI) myocardial infarction [facility])   Coronary artery disease involving native coronary artery of native heart with unstable angina pectoris (HCC)   Acute combined systolic and diastolic congestive heart failure (HCC)   Acute on chronic respiratory failure with hypoxia (HCC)   Diabetes mellitus with complication (HCC)   Essential hypertension   Allergies Allergies  Allergen Reactions  . Atorvastatin Other (See Comments)    Other reaction(s): Myalgias (intolerance)  . Penicillins Itching and Rash    Family members do not know the specifics  . Sulfur Itching and Rash    ITCHING & RASH    Diagnostic Studies/Procedures    Cardiac Cath 02/15/2017 Conclusion     Mid LAD lesion, 70 %stenosed.  Ost 1st Diag to 1st Diag lesion, 95 %stenosed.  Prox RCA lesion, 90 %stenosed.  Mid RCA lesion, 100 %stenosed.  Prox Cx lesion, 80 %stenosed.  1. Significant heavily calcified 3 vessel coronary artery disease with chronically occluded right coronary artery with left-to-right collaterals, small left circumflex with diffuse proximal disease and borderline significant mid LAD disease which appears to be a complex plaque with the appearance of double-lumen. 2. Severely elevated left ventricular end-diastolic pressure at 36 mmHg.      _____________  Echocardiogram 02/13/2017 Study Conclusions - Left ventricle: The cavity size was normal. Wall thickness was  increased in a pattern of mild LVH. Systolic function was mildly   to moderately reduced. The estimated ejection fraction was in the   range of 40% to 45%. There is hypokinesis of the mid-apical   septal myocardium. There is hypokinesis of the mid-apicalinferior   myocardium. - Mitral valve: Calcified annulus. There was mild regurgitation.   Valve area by continuity equation (using LVOT flow): 1.53 cm^2. - Pulmonary arteries: PA peak pressure: 34 mm Hg (S).  Impressions: - Hypokinesis of the distal septum and inferior wall; overall mild   to moderate LV dysfunction; mild LVH; mild MR.    History of Present Illness     Meagan Roberts is a 83 y.o. female with a history of CAD status post remote stenting, stroke, diabetes, hypertension, hyperlipidemia, and depression who transferred from [facility] for chest pain.  [name] admitted to [facility] 02/02/17 for acute hypoxic respiratory failure in setting of sepsis 2nd to pneumonia. She initially presented for fever greater than 104. She was started IV Rocephin and azithromycin. He was also given IV vancomycin and gentamicin. Her troponin peaked to 0.99. EKG with new left bundle-branch block. Cardiology consultant and felt demand ischemia in setting of acute illness. 2-D echocardiogram showed left ventricular EF of 50-55%, technically difficult study. Cardiology signed off, however the patient complained of she had intermittent chest pain and requested transfer to [facility]. Her creatinine peaked to 3.6 during admission. Patient was seen by general surgery (Dr. Georgiana Roberts) or sacral decubitus ulce. Did not feel was the source of fever.     Hospital Course     Consultants: [facility] was consulted to assist with medical care.   The patient was initially received to the ICU on BiPAP. She was treated for acute on chronic respiratory failure secondary to CAP and sepsis. She has greatly improved and is currently on  Oxygen at 4L (her usual home O2 is 2L). She was also treated for acute on chronic systolic and diastolic heart failure. She diuresed ~10L since admission guided by renal function. She will be discharged on lasix 80 mg daily and will need frequent reevaluation of renal parameters. Hopefully she can gradually wean off of the oxygen. Her BP does not allow much additional titration of vasodilators. Avoid RAAS inhibitors.   A cardiac catheterization was done on 02/15/2017 and unfortunately her coronary anatomy is very complex and not amenable to percutaneous revascularization. She is a poor candidate for bypass surgery due to respiratory failure, obesity, poor functionally status and worsening renal function. She is continued on medical therapy with aspirin, Plavix, coreg and Imdur (increased to 60 mg). No statin due to previous atorvastatin allergy.   Per hospitalist, Dr. Jarvis Roberts:  HbA1c 8.4% indicating average CBG of 195mg /dl. Suspect infection-related hyperglycemia initially. Made incremental increase to lantus 20u > 25u daily (50u daily at home) and 2u meal coverage + resistant SSI TIDAC (15, 15, 20u w/meals at home) with adequate control. Would continue this at discharge with plans to titrate depending on her dietary intake as outpatient.  On day of discharge labs include: SCr 2.09,  K+ 3.9, CO2 35 BNP on 02/10/17 331.9,  02/13/17 219.7  Patient has been seen by Dr. Royann Roberts today and deemed ready for discharge home. All follow up appointments have been scheduled. Discharge medications are listed below.  _____________  Discharge Vitals Blood pressure 122/71, pulse 76, temperature 98.6 F (37 C), temperature source Oral, resp. rate 18, height 5\' 5"  (1.651 m), weight 228 lb 9.6 oz (103.7 kg), SpO2 95 %.  Filed Weights   02/18/17 0600 02/19/17 0630 02/20/17 0522  Weight: 227 lb 12.8 oz (103.3 kg) 229 lb 9.6 oz (104.1 kg) 228 lb 9.6 oz (103.7 kg)    Labs & Radiologic Studies    CBC No results for  input(s): WBC, NEUTROABS, HGB, HCT, MCV, PLT in the last 72 hours. Basic Metabolic Panel  Recent Labs  02/19/17 0220 02/20/17 0320  NA 134* 135  K 3.9 3.9  CL 91* 92*  CO2 37* 35*  GLUCOSE 94 96  BUN 32* 34*  CREATININE 2.02* 2.09*  CALCIUM 8.7* 8.8*   Liver Function Tests No results for input(s): AST, ALT, ALKPHOS, BILITOT, PROT, ALBUMIN in the last 72 hours. No results for input(s): LIPASE, AMYLASE in the last 72 hours. Cardiac Enzymes No results for input(s): CKTOTAL, CKMB, CKMBINDEX, TROPONINI in the last 72 hours. BNP Invalid input(s): POCBNP D-Dimer No results for input(s): DDIMER in the last 72 hours. Hemoglobin A1C No results for input(s): HGBA1C in the last 72 hours. Fasting Lipid Panel No results for input(s): CHOL, HDL, LDLCALC, TRIG, CHOLHDL, LDLDIRECT in the last 72 hours. Thyroid Function Tests No results for input(s): TSH, T4TOTAL, T3FREE, THYROIDAB in the last 72 hours.  Invalid input(s): FREET3 _____________  Dg Chest Port 1 View  Result Date: 02/15/2017 CLINICAL DATA:  Shortness of Breath EXAM: PORTABLE CHEST 1 VIEW COMPARISON:  February 13, 2017 FINDINGS: There remains generalized interstitial edema. There is probable patchy alveolar edema in the bases. There are bilateral pleural effusions. There is atelectatic change in each mid lung. There is cardiomegaly with pulmonary venous  hypertension. No adenopathy is evident. There is aortic atherosclerosis. There is postoperative change in the left clavicle. IMPRESSION: Findings indicative of congestive heart failure, similar to most recent study. There is new patchy mid lung atelectasis bilaterally. There is aortic atherosclerosis. Aortic Atherosclerosis (ICD10-I70.0). Electronically Signed   By: Bretta Bang III M.D.   On: 02/15/2017 08:46   Dg Chest Port 1 View  Result Date: 02/13/2017 CLINICAL DATA:  Acute respiratory failure with hypoxia.  Dyspnea. EXAM: PORTABLE CHEST 1 VIEW COMPARISON:  Chest radiograph from  one day prior. FINDINGS: Stable cardiomediastinal silhouette with cardiomegaly. No pneumothorax. Stable small to moderate bilateral pleural effusions. Mild-to-moderate pulmonary edema, slightly improved. Hazy bibasilar lung opacities, stable. IMPRESSION: 1. Mild-to-moderate congestive heart failure, slightly improved . 2. Stable small to moderate bilateral pleural effusions. 3. Stable hazy bibasilar lung opacities, favor atelectasis. Electronically Signed   By: Delbert Phenix M.D.   On: 02/13/2017 07:47   Dg Chest Port 1 View  Result Date: 02/12/2017 CLINICAL DATA:  Acute respiratory failure EXAM: PORTABLE CHEST 1 VIEW COMPARISON:  Two days ago FINDINGS: Cardiopericardial enlargement, similar to prior when accounting for rotation. There is diffuse interstitial opacity. Haziness of the chest attributed to layering pleural effusions. No pneumothorax. IMPRESSION: CHF. Increased hazy density of the chest suggests layering bilateral effusions. Electronically Signed   By: Marnee Spring M.D.   On: 02/12/2017 07:19   Dg Chest Port 1 View  Result Date: 02/10/2017 CLINICAL DATA:  Dyspnea. EXAM: PORTABLE CHEST 1 VIEW COMPARISON:  02/10/2017 at 0814 hours FINDINGS: Patient rotated minimally right. Midline trachea. Cardiomegaly accentuated by AP portable technique. Small layering bilateral pleural effusions. No pneumothorax. Moderate interstitial edema is not significantly changed. Bibasilar airspace disease is similar, given differences in technique. Mildly degraded exam due to AP portable technique and patient body habitus. IMPRESSION: No significant change since earlier today. Congestive heart failure with layering bilateral pleural effusions and bibasilar airspace disease. Electronically Signed   By: Jeronimo Greaves M.D.   On: 02/10/2017 16:37   Disposition   Pt is being discharged home today in good condition.  Follow-up Plans & Appointments     Contact information for follow-up providers    [facility]. Schedule an appointment as soon as possible for a visit.   Specialty:  Pulmonology Why:  Call to make an appointment to be evaluated for sleep apnea Contact information: [address] [place] [address] [phone]           Contact information for after-discharge care    Destination    [facility] .   Specialty:  Skilled Nursing Facility Contact information: [address] [place] [address] [phone]                 Discharge Instructions    (HEART FAILURE PATIENTS) Call MD:  Anytime you have any of the following symptoms: 1) 3 pound weight gain in 24 hours or 5 pounds in 1 week 2) shortness of breath, with or without a dry hacking cough 3) swelling in the hands, feet or stomach 4) if you have to sleep on extra pillows at night in order to breathe.    Complete by:  As directed    Diet - low sodium heart healthy    Complete by:  As directed    Increase activity slowly    Complete by:  As directed       Discharge Medications   Current Discharge Medication List  START taking these medications   Details  Amino Acids-Protein Hydrolys (FEEDING SUPPLEMENT, PRO-STAT SUGAR FREE 64,) LIQD Take 30 mLs by mouth 2 (two) times daily. Qty: 900 mL, Refills: 0    amLODipine (NORVASC) 10 MG tablet Take 1 tablet (10 mg total) by mouth daily. Qty: 30 tablet, Refills: 5    aspirin 81 MG EC tablet Take 1 tablet (81 mg total) by mouth daily. Qty: 30 tablet, Refills: 11    feeding supplement, ENSURE ENLIVE, (ENSURE ENLIVE) LIQD Take 237 mLs by mouth 2 (two) times daily between meals. Qty: 237 mL, Refills: 12    hydrALAZINE (APRESOLINE) 25 MG tablet Take 1.5 tablets (37.5 mg total) by mouth 3 (three) times daily. Qty: 135 tablet, Refills: 5    insulin aspart (NOVOLOG) 100 UNIT/ML injection Inject 0-20 Units into the skin 3 (three) times daily with meals. Qty: 10 mL, Refills: 11    insulin glargine (LANTUS) 100  UNIT/ML injection Inject 0.25 mLs (25 Units total) into the skin daily. Qty: 10 mL, Refills: 11    isosorbide mononitrate (IMDUR) 60 MG 24 hr tablet Take 1 tablet (60 mg total) by mouth daily. Qty: 30 tablet, Refills: 11    nitroGLYCERIN (NITROSTAT) 0.4 MG SL tablet Place 1 tablet (0.4 mg total) under the tongue every 5 (five) minutes x 3 doses as needed for chest pain. Qty: 25 tablet, Refills: 6    polyethylene glycol (MIRALAX / GLYCOLAX) packet Take 17 g by mouth daily. Qty: 14 each, Refills: 0      CONTINUE these medications which have CHANGED   Details  carvedilol (COREG) 12.5 MG tablet Take 1 tablet (12.5 mg total) by mouth 2 (two) times daily with a meal. Qty: 60 tablet, Refills: 5    furosemide (LASIX) 80 MG tablet Take 1 tablet (80 mg total) by mouth daily. Qty: 30 tablet, Refills: 5    insulin lispro (HUMALOG KWIKPEN) 100 UNIT/ML KiwkPen Inject 0.02 mLs (2 Units total) into the skin 3 (three) times daily. 2 Units with each meal, breakfast lunch and dinner. Titrate up as needed for meal coverage as pt recovers from illness. Qty: 15 mL, Refills: 0      CONTINUE these medications which have NOT CHANGED   Details  clopidogrel (PLAVIX) 75 MG tablet Take 75 mg by mouth daily.  Refills: 0    colchicine 0.6 MG tablet Take 0.6 mg by mouth daily.  Refills: 0    gabapentin (NEURONTIN) 300 MG capsule Take 300 mg by mouth at bedtime.     pantoprazole (PROTONIX) 40 MG tablet Take 40 mg by mouth 2 (two) times daily. Refills: 0    prednisoLONE acetate (PRED FORTE) 1 % ophthalmic suspension Place 1 drop into the right eye 2 (two) times daily.  Refills: 0    traZODone (DESYREL) 50 MG tablet Take 50 mg by mouth at bedtime. Refills: 0      STOP taking these medications     AZOR 10-40 MG per tablet      LANTUS SOLOSTAR 100 UNIT/ML Solostar Pen           Outstanding Labs/Studies   Pt will need a basic metabolic panel on Thursday 02/23/17 to follow renal function. Send  results to [facility], Dr. Royann Shiversroitoru.  Duration of Discharge Encounter   Greater than 30 minutes including physician time.  Signed, Berton BonJanine Zayyan Mullen NP 02/20/2017, 3:21 PM

## 2017-02-20 NOTE — Progress Notes (Signed)
Physical Therapy Treatment Patient Details Name: Meagan Roberts MRN: [id number] DOB: 03/19/1927 Today's Date: 02/20/2017    History of Present Illness Patient is a 83 y/o female who [facility] for acute hypoxic respiratory failure and sepsis secondary to pneumonia, transferred to [facility] 7/6 due to new left BBB and elevated troponin. Cardiac cath  7/11 showed calcified 3 vessel CAD without PCI options being treated medically. CXR- CHF with edema. Admitted for NSTEMI. PMH includes asthma, CHF, CKD, DM, HTN, CAD, and stroke.    PT Comments    Patient progressing well towards PT goals. Tolerated gait training with Min A for balance/safety. Pt with weakness in BLEs with 1 instance of knee buckling during activity. Pt not forthcoming with symptoms and masks them. 2/4 DOE with Sp02 >89% on 4L/min 02. Forced seated rest break due to above. [facility]. Will follow.  Follow Up Recommendations  SNF;Supervision for mobility/OOB;Supervision/Assistance - 24 hour     Equipment Recommendations  None recommended by PT    Recommendations for Other Services       Precautions / Restrictions Precautions Precautions: Fall Precaution Comments: watch 02 Restrictions Weight Bearing Restrictions: No    Mobility  Bed Mobility               General bed mobility comments: Sitting EOB upon PT arrival.   Transfers Overall transfer level: Needs assistance Equipment used: Rolling walker (2 wheeled) Transfers: Sit to/from [employer];Stand Pivot Transfers Sit to Stand: Min assist;Mod assist Stand pivot transfers: Min assist       General transfer comment: Assist to power to standing with cues for hand placement/technique; stood from EOB x1, from [facility] x[facility], from chair x1. More assist needed from chair when fatigued.  SPT bed to [facility] with max directional cues.   Ambulation/Gait Ambulation/Gait assistance: Min assist;+2 safety/equipment Ambulation Distance (Feet): 28 Feet  (+40') Assistive device: Rolling walker (2 wheeled) Gait Pattern/deviations: Step-through pattern;Decreased stride length;Trunk flexed;Wide base of support Gait velocity: decreased   General Gait Details: Slow, unsteady gait with 2/4 DOE. SP02 stayed >89% on 4L/min 02. Other VSS. Bil knee instability noted with 1 instance of knee buckling. 1 seated rest break.   Stairs            Wheelchair Mobility    Modified Rankin (Stroke Patients Only)       Balance Overall balance assessment: Needs assistance Sitting-balance support: Feet supported;Single extremity supported Sitting balance-Leahy Scale: Fair Sitting balance - Comments: Posterior lean sitting EOB.   Standing balance support: During functional activity;Bilateral upper extremity supported Standing balance-Leahy Scale: Poor Standing balance comment: Reliant on BUEs for support in standing.                             Cognition Arousal/Alertness: Awake/alert Behavior During Therapy: WFL for tasks assessed/performed Overall Cognitive Status: Within Functional Limits for tasks assessed                                 General Comments: for basic tasks. Per daughter, pt masks symptoms and will not be forthcoming with them.      Exercises      General Comments General comments (skin integrity, edema, etc.): Daughter present during session.      Pertinent Vitals/Pain Pain Assessment: No/denies pain    Home Living  Prior Function            PT Goals (current goals can now be found in the care plan section) Progress towards PT goals: Progressing toward goals    Frequency    Min 2X/week      PT Plan Current plan remains appropriate    Co-evaluation              AM-PAC PT "6 Clicks" Daily Activity  Outcome Measure  Difficulty turning over in bed (including adjusting bedclothes, sheets and blankets)?: None Difficulty moving from lying on  back to sitting on the side of the bed? : Total Difficulty sitting down on and standing up from a chair with arms (e.g., wheelchair, bedside commode, etc,.)?: Total Help needed moving to and from a bed to chair (including a wheelchair)?: A Little Help needed walking in hospital room?: A Little Help needed climbing 3-5 steps with a railing? : Total 6 Click Score: 13    End of Session Equipment Utilized During Treatment: Gait belt;Oxygen Activity Tolerance: Patient limited by fatigue Patient left: in chair;with call bell/phone within reach;with family/visitor present Nurse Communication: Mobility status PT Visit Diagnosis: Unsteadiness on feet (R26.81);Muscle weakness (generalized) (M62.81);Other abnormalities of gait and mobility (R26.89)     Time: 1610-9604 PT Time Calculation (min) (ACUTE ONLY): 26 min  Charges:  $Gait Training: 8-22 mins $Therapeutic Activity: 8-22 mins                    G Codes:       Mylo Red, PT, DPT [phone]     [name] 02/20/2017, 10:06 AM

## 2017-02-20 NOTE — Clinical Social Work Placement (Signed)
   CLINICAL SOCIAL WORK PLACEMENT  NOTE  Date:  02/20/2017  Patient Details  Name: Meagan HartMary F Flahive MRN: [id number] Date of Birth: 04/13/1927  Clinical Social Work is seeking post-discharge placement for this patient at the Skilled  Nursing Facility level of care (*CSW will initial, date and re-position this form in  chart as items are completed):  Yes   Patient/family provided with [place] Clinical Social Work Department's list of facilities offering this level of care within the geographic area requested by the patient (or if unable, by the patient's family).  Yes   Patient/family informed of their freedom to choose among providers that offer the needed level of care, that participate in Medicare, Medicaid or managed care program needed by the patient, have an available bed and are willing to accept the patient.  Yes   Patient/family informed of [facility]'s ownership interest in [facility] and [facility], as well as of the fact that they are under no obligation to receive care at these facilities.  PASRR submitted to EDS on       PASRR number received on       Existing PASRR number confirmed on 02/14/17     FL2 transmitted to all facilities in geographic area requested by pt/family on 02/14/17     FL2 transmitted to all facilities within larger geographic area on       Patient informed that his/her managed care company has contracts with or will negotiate with certain facilities, including the following:  Other - please specify in the comment section below: ([facility])     Yes   Patient/family informed of bed offers received.  Patient chooses bed at Other - please specify in the comment section below: ([facility])     Physician recommends and patient chooses bed at      Patient to be transferred to Other - please specify in the comment section below: ([facility]) on 02/20/17.  Patient to be transferred to  facility by PTAR     Patient family notified on 02/20/17 of transfer.  Name of family member notified:  Heron NayMary Calvin, daughter     PHYSICIAN       Additional Comment:    _______________________________________________ Abigail ButtsSusan Selig [place], LCSW 02/20/2017, 3:54 PM

## 2017-02-20 NOTE — Progress Notes (Signed)
[facility] Anticipated discharge date: 02/20/17 Family notified: Heron NayMary Calvin, daughter Transportation by: Sharin MonsPTAR   CSW signing off.  Abigail ButtsSusan Matha Masse, LCSWA  Clinical Social Worker

## 2017-02-20 NOTE — Progress Notes (Signed)
Progress Note  Patient Name: Meagan Roberts Date of Encounter: 02/20/2017  Primary Cardiologist: New (Ahlaya Ende)  Subjective   Continues to improve. Has just walked in the hall without chest pain and no perceived increase in dyspnea. Still requiring 4L oxygen.   Inpatient Medications    Scheduled Meds: . amLODipine  10 mg Oral Daily  . aspirin EC  81 mg Oral Daily  . carvedilol  12.5 mg Oral BID WC  . chlorhexidine  15 mL Mouth Rinse BID  . clopidogrel  75 mg Oral Daily  . colchicine  0.6 mg Oral Daily  . feeding supplement (ENSURE ENLIVE)  237 mL Oral BID BM  . feeding supplement (PRO-STAT SUGAR FREE 64)  30 mL Oral BID  . furosemide  80 mg Oral Daily  . gabapentin  300 mg Oral QHS  . heparin  5,000 Units Subcutaneous Q8H  . hydrALAZINE  37.5 mg Oral TID  . insulin aspart  0-20 Units Subcutaneous TID WC  . insulin aspart  0-5 Units Subcutaneous QHS  . insulin aspart  2 Units Subcutaneous TID WC  . insulin glargine  25 Units Subcutaneous Daily  . isosorbide mononitrate  60 mg Oral Daily  . mouth rinse  15 mL Mouth Rinse q12n4p  . pantoprazole  40 mg Oral BID  . polyethylene glycol  17 g Oral Daily  . prednisoLONE acetate  1 drop Right Eye BID  . senna  1 tablet Oral Daily  . sodium chloride flush  3 mL Intravenous Q12H   Continuous Infusions: . sodium chloride Stopped (02/16/17 0000)   PRN Meds: sodium chloride, acetaminophen, ALPRAZolam, alum & mag hydroxide-simeth, nitroGLYCERIN, ondansetron (ZOFRAN) IV, senna-docusate, sodium chloride flush, traZODone   Vital Signs    Vitals:   02/19/17 0630 02/19/17 1427 02/19/17 2023 02/20/17 0522  BP: 134/80 128/82 (!) 105/51 122/71  Pulse: 82 81 76   Resp: (!) 24 (!) 21 18   Temp: 98.5 F (36.9 C) 98.2 F (36.8 C) 98.4 F (36.9 C) 98.6 F (37 C)  TempSrc: Oral Oral Oral Oral  SpO2: 92% 94% 94% 95%  Weight: 229 lb 9.6 oz (104.1 kg)   228 lb 9.6 oz (103.7 kg)  Height:        Intake/Output Summary (Last 24 hours) at  02/20/17 0934 Last data filed at 02/20/17 0900  Gross per 24 hour  Intake              960 ml  Output              700 ml  Net              260 ml   Filed Weights   02/18/17 0600 02/19/17 0630 02/20/17 0522  Weight: 227 lb 12.8 oz (103.3 kg) 229 lb 9.6 oz (104.1 kg) 228 lb 9.6 oz (103.7 kg)    Telemetry    NSR w 1st degree AVB in the 70's - Personally Reviewed  ECG    No new ECG - Personally Reviewed  Physical Exam  Smiling. Obesity limits the exam GEN: No acute distress.   Neck: mild JVD Cardiac: RRR, no murmurs, rubs, or gallops. Paradoxically split S2 Respiratory: Clear to auscultation bilaterally. GI: Soft, nontender, non-distended  MS: No edema; No deformity. Neuro:  Nonfocal  Psych: Normal affect   Labs    Chemistry  Recent Labs Lab 02/18/17 0229 02/19/17 0220 02/20/17 0320  NA 134* 134* 135  K 3.9 3.9 3.9  CL 92* 91*  92*  CO2 34* 37* 35*  GLUCOSE 125* 94 96  BUN 31* 32* 34*  CREATININE 2.15* 2.02* 2.09*  CALCIUM 8.8* 8.7* 8.8*  GFRNONAA 19* 21* 20*  GFRAA 23* 24* 23*  ANIONGAP 8 6 8      Hematology  Recent Labs Lab 02/14/17 0244 02/15/17 0238 02/16/17 0500  WBC 14.5* 10.5 7.0  RBC 4.69 4.38 4.63  HGB 11.9* 11.1* 11.9*  HCT 37.2 34.9* 37.0  MCV 79.3 79.7 79.9  MCH 25.4* 25.3* 25.7*  MCHC 32.0 31.8 32.2  RDW 16.8* 16.9* 17.3*  PLT 281 271 307   BNPNo results for input(s): BNP, PROBNP in the last 168 hours.  Cardiac Studies   Cardiac Cath 02/15/2017 Conclusion     Mid LAD lesion, 70 %stenosed.  Ost 1st Diag to 1st Diag lesion, 95 %stenosed.  Prox RCA lesion, 90 %stenosed.  Mid RCA lesion, 100 %stenosed.  Prox Cx lesion, 80 %stenosed.  1. Significant heavily calcified 3 vessel coronary artery disease with chronically occluded right coronary artery with left-to-right collaterals, small left circumflex with diffuse proximal disease and borderline significant mid LAD disease which appears to be a complex plaque with the  appearance of double-lumen. 2. Severely elevated left ventricular end-diastolic pressure at 36 mmHg.     Patient Profile     83 y.o. female presenting with acute respiratory failure, new LBBB and small NSTEMI, severe three-vessel coronary artery disease, not amenable to PCI, not a good candidate for bypass surgery due to age and comorbid conditions, acute on chronic heart failure management difficult due to worsening renal function, possible transient contrast induced nephrotoxicity  Assessment & Plan    1.Acute on chronic combined systolic and diastolic heart failure: Net diuresis 10 L since admission, roughly 4.5 L since cardiac catheterization when she had severely elevated filling pressures. Only 700 ml out over the past 24 h. Oral diuretics resumed, as renal function has improved. Now on lasix 80 mg daily. On beta blockers and combined with hydralazine and nitrates. At least in near future avoid ARB.  Continues to require increase oxygen. On 2L at home, now on 4L.  2. CAD: Unfortunately her coronary anatomy is very complex and not amenable to percutaneous revascularization. She is a very poor candidate for bypass surgery due to renal failure, obesity, poor functional status and worsening renal function. Medical therapy with aspirin, Plavix, coreg and Imdur (increased to 60 mg). No statin due to previous atorvastatin allergy.  3. Acute on chronic renal insufficiency: Improving. Keep off ARB. Continuing  oral diuretics with lasix 80 mg daily. Optimizing hydralazine/isosorbide. Not a great candidate for chronic hemodialysis.  4. HTN: Pressure is mostly in the desirable range  5. DM Type 2, insulin-requiring: Mediocre control, A1c 8.4%  6. Stage II sacral decubitus ulcer: Appropriate wound care has been initiated, reposition frequently  7. Pneumonia with respiratory failure and sepsis, resolved  Needs PT evaluation and may need SNF due to poor functional status and deconditioning. On  Friday PT felt that pt would benefit from short term SNF to maximize independence and mobility prior to return home. Not seen over the weekend. Will see how she does with PT today.   Signed, Berton BonJanine Hammond, NP  02/20/2017, 9:34 AM    I have seen and examined the patient along with Berton BonJanine Hammond, NP.  I have reviewed the chart, notes and new data.  I agree with NP's note.  Key new complaints: She is subjectively better but continues to require oxygen. Denies angina. Key  examination changes: Not sure in/out is accurately recorded. Weight is down 1 pound since yesterday. No real change in renal function. Key new findings / data: Creat 2.09  PLAN: Continue on current dose of oral diuretics with frequent reevaluation of renal parameters. Hopefully will gradually wean off O2. BP does not allow much additional titration of vasodilators. Avoid RAAS inhibitors. Possible DC to SNF today.  Thurmon Fair, MD, [facility] [phone] 02/20/2017, 11:14 AM

## 2017-03-02 ENCOUNTER — Ambulatory Visit: Payer: Medicare Other | Admitting: Physician Assistant

## 2017-03-06 ENCOUNTER — Ambulatory Visit: Payer: Medicare HMO | Admitting: Cardiology

## 2017-03-07 ENCOUNTER — Ambulatory Visit (INDEPENDENT_AMBULATORY_CARE_PROVIDER_SITE_OTHER): Payer: Medicare HMO | Admitting: Cardiology

## 2017-03-07 ENCOUNTER — Encounter: Payer: Self-pay | Admitting: Cardiology

## 2017-03-07 VITALS — BP 118/54 | HR 76 | Ht 66.0 in | Wt 222.0 lb

## 2017-03-07 DIAGNOSIS — J9611 Chronic respiratory failure with hypoxia: Secondary | ICD-10-CM | POA: Diagnosis not present

## 2017-03-07 DIAGNOSIS — I5041 Acute combined systolic (congestive) and diastolic (congestive) heart failure: Secondary | ICD-10-CM | POA: Diagnosis not present

## 2017-03-07 DIAGNOSIS — Z794 Long term (current) use of insulin: Secondary | ICD-10-CM | POA: Diagnosis not present

## 2017-03-07 DIAGNOSIS — J189 Pneumonia, unspecified organism: Secondary | ICD-10-CM

## 2017-03-07 DIAGNOSIS — I10 Essential (primary) hypertension: Secondary | ICD-10-CM

## 2017-03-07 DIAGNOSIS — E118 Type 2 diabetes mellitus with unspecified complications: Secondary | ICD-10-CM

## 2017-03-07 DIAGNOSIS — Z79899 Other long term (current) drug therapy: Secondary | ICD-10-CM

## 2017-03-07 DIAGNOSIS — I25110 Atherosclerotic heart disease of native coronary artery with unstable angina pectoris: Secondary | ICD-10-CM | POA: Diagnosis not present

## 2017-03-07 DIAGNOSIS — J9621 Acute and chronic respiratory failure with hypoxia: Secondary | ICD-10-CM

## 2017-03-07 DIAGNOSIS — E669 Obesity, unspecified: Secondary | ICD-10-CM

## 2017-03-07 DIAGNOSIS — I447 Left bundle-branch block, unspecified: Secondary | ICD-10-CM | POA: Diagnosis not present

## 2017-03-07 DIAGNOSIS — IMO0001 Reserved for inherently not codable concepts without codable children: Secondary | ICD-10-CM

## 2017-03-07 NOTE — Assessment & Plan Note (Signed)
IDDM with CRI 

## 2017-03-07 NOTE — Assessment & Plan Note (Signed)
[facility] 02/02/17 secondary to CAP, then had chest pain and an elevated Troponin and [facility] 02/15/17

## 2017-03-07 NOTE — Assessment & Plan Note (Signed)
Controlled.  

## 2017-03-07 NOTE — Assessment & Plan Note (Signed)
On home O2 

## 2017-03-07 NOTE — Assessment & Plan Note (Signed)
Diuresed 10L. EF 40-45% by echo 02/13/17 Seems stable volume wise today

## 2017-03-07 NOTE — Assessment & Plan Note (Signed)
[facility]

## 2017-03-07 NOTE — Assessment & Plan Note (Signed)
BMI suspect she has some component of hypoventilation

## 2017-03-07 NOTE — Assessment & Plan Note (Signed)
Severe 3V CAD at cath 02/15/17- not amenable to PCI or CABG- medical Rx

## 2017-03-07 NOTE — Progress Notes (Signed)
03/07/2017 Meagan Roberts   07/13/1927  [id number]  Primary Physician Sol Passerough, Doris Cheadleobert L, MD Primary Cardiologist: Dr Royann Shiversroitoru  HPI:  Pleasant 83 y/o female who had been living at home with a history of DM. [facility] with CAP and respiratory failure. She developed chest pain a few days after admission with a new LBBB and Troponin elevation. [facility]. Her respiratory failure was treated. It was felt to be a combination of CAP and combined CHF. She did diurese 10L. Her discharge wgt was 228 lbs, today's wgt 222 lbs. On 02/15/17 she had a cath which revealed 3V CAD not amenable to PCI or CABG. Echo done 02/13/17 showed her EF to be 40-45%. Her Troponin peaked at 7.8. She slowly improved and was able to be discharged to a SNF for rehab 02/20/17.   She is in the office today for follow up. She is in a wheel chair and on O2. She is obese. She is in good spirits. Her daughter accompanied her. They hope she will returning home soon. She denies any increased dyspnea or chest pain.    Current Outpatient Prescriptions  Medication Sig Dispense Refill  . Amino Acids-Protein Hydrolys (FEEDING SUPPLEMENT, PRO-STAT SUGAR FREE 64,) LIQD Take 30 mLs by mouth 2 (two) times daily. 900 mL 0  . amLODipine (NORVASC) 10 MG tablet Take 1 tablet (10 mg total) by mouth daily. 30 tablet 5  . aspirin 81 MG EC tablet Take 1 tablet (81 mg total) by mouth daily. 30 tablet 11  . carvedilol (COREG) 12.5 MG tablet Take 1 tablet (12.5 mg total) by mouth 2 (two) times daily with a meal. 60 tablet 5  . clopidogrel (PLAVIX) 75 MG tablet Take 75 mg by mouth daily.   0  . colchicine 0.6 MG tablet Take 0.6 mg by mouth daily.   0  . diclofenac sodium (VOLTAREN) 1 % GEL Use as directed    . ezetimibe (ZETIA) 10 MG tablet Take 1 tablet by mouth daily.    . feeding supplement, ENSURE ENLIVE, (ENSURE ENLIVE) LIQD Take 237 mLs by mouth 2 (two) times daily between meals. 237 mL 12  . furosemide  (LASIX) 20 MG tablet Take 1 tablet by mouth daily.    . furosemide (LASIX) 80 MG tablet Take 1 tablet (80 mg total) by mouth daily. 30 tablet 5  . gabapentin (NEURONTIN) 300 MG capsule Take 300 mg by mouth at bedtime.     . hydrALAZINE (APRESOLINE) 25 MG tablet Take 1.5 tablets (37.5 mg total) by mouth 3 (three) times daily. 135 tablet 5  . insulin aspart (NOVOLOG) 100 UNIT/ML injection Inject 0-20 Units into the skin 3 (three) times daily with meals. 10 mL 11  . insulin glargine (LANTUS) 100 UNIT/ML injection Inject 0.25 mLs (25 Units total) into the skin daily. 10 mL 11  . insulin lispro (HUMALOG KWIKPEN) 100 UNIT/ML KiwkPen Inject 0.02 mLs (2 Units total) into the skin 3 (three) times daily. 2 Units with each meal, breakfast lunch and dinner. Titrate up as needed for meal coverage as pt recovers from illness. 15 mL 0  . isoniazid (NYDRAZID) 300 MG tablet Take 1 tablet by mouth daily.    . isosorbide mononitrate (IMDUR) 60 MG 24 hr tablet Take 1 tablet (60 mg total) by mouth daily. 30 tablet 11  . nitroGLYCERIN (NITROSTAT) 0.4 MG SL tablet Place 1 tablet (0.4 mg total) under the tongue every 5 (five) minutes x 3  doses as needed for chest pain. 25 tablet 6  . pantoprazole (PROTONIX) 40 MG tablet Take 40 mg by mouth 2 (two) times daily.  0  . polyethylene glycol (MIRALAX / GLYCOLAX) packet Take 17 g by mouth daily. 14 each 0  . prednisoLONE acetate (PRED FORTE) 1 % ophthalmic suspension Place 1 drop into the right eye 2 (two) times daily.   0  . traZODone (DESYREL) 50 MG tablet Take 50 mg by mouth at bedtime.  0  . vitamin C (ASCORBIC ACID) 500 MG tablet Take 500 mg by mouth daily.    Marland Kitchen zinc sulfate 220 (50 Zn) MG capsule Take 220 mg by mouth daily.     No current facility-administered medications for this visit.     Allergies  Allergen Reactions  . Atorvastatin Other (See Comments)    Other reaction(s): Myalgias (intolerance)  . Penicillins Itching and Rash    Family members do not know the  specifics  . Sulfur Itching and Rash    ITCHING & RASH    Past Medical History:  Diagnosis Date  . Arthritis   . Asthma   . CHF (congestive heart failure) (HCC)   . CKD (chronic kidney disease), stage III   . Depression   . Diabetes mellitus without complication (HCC)   . Hypertension   . MI (myocardial infarction) (HCC)   . Pneumonia 02/2017  . Stroke [facility])     Social History   Social History  . Marital status: Divorced    Spouse name: N/A  . Number of children: N/A  . Years of education: N/A   Occupational History  . Not on file.   Social History Main Topics  . Smoking status: Never Smoker  . Smokeless tobacco: Never Used  . Alcohol use No  . Drug use: No  . Sexual activity: Not on file   Other Topics Concern  . Not on file   Social History Narrative  . No narrative on file     Family History  Problem Relation Age of Onset  . Hypertension Mother   . Hypertension Father      Review of Systems: General: negative for chills, fever, night sweats or weight changes.  Cardiovascular: negative for chest pain, dyspnea on exertion, edema, orthopnea, palpitations, paroxysmal nocturnal dyspnea or shortness of breath Dermatological: negative for rash Respiratory: negative for cough or wheezing Urologic: negative for hematuria Abdominal: negative for nausea, vomiting, diarrhea, bright red blood per rectum, melena, or hematemesis Neurologic: negative for visual changes, syncope, or dizziness All other systems reviewed and are otherwise negative except as noted above.    Blood pressure (!) 118/54, pulse 76, height 5\' 6"  (1.676 m), weight 222 lb (100.7 kg).  General appearance: alert, cooperative, appears stated age, no distress and moderately obese Neck: no carotid bruit and no JVD Lungs: clear to auscultation bilaterally Heart: regular rate and rhythm Extremities: trace edema Skin: Skin color, texture, turgor normal. No rashes or lesions Neurologic: Grossly  normal   ASSESSMENT AND PLAN:   Acute on chronic respiratory failure with hypoxia (HCC) Pt admitted to [facility] with respiratory failure 02/02/17 secondary to CAP, then had chest pain and an elevated Troponin and transferred to [facility] 02/15/17  Acute combined systolic and diastolic heart failure (HCC) Diuresed 10L. EF 40-45% by echo 02/13/17 Seems stable volume wise today  CAD of native heart with unstable angina pectoris (HCC) Severe 3V CAD at cath 02/15/17- not amenable to PCI or CABG- medical Rx  Chronic respiratory failure (  HCC) On home O2  Insulin dependent diabetes mellitus with complications (HCC) IDDM with CRI  Essential hypertension Controlled  Obesity (BMI 30-39.9) BMI suspect she has some component of hypoventilation  LBBB (left bundle branch block) New 02/02/17  CAP (community acquired pneumonia) [facility]   PLAN  I did not change her current medications. I di order f/u labs- her SCr was 2.09 at discharge.   Corine ShelterLuke Lashunta Frieden PA-C 03/07/2017 3:57 PM

## 2017-03-07 NOTE — Assessment & Plan Note (Signed)
New 02/02/17

## 2017-03-07 NOTE — Patient Instructions (Signed)
Medication Instructions:   No changes  Labwork:   BMP today in office  Testing/Procedures:  none  Follow-Up:  Your physician recommends that you schedule a follow-up appointment in: 2 months w/ Dr. Royann Shiversroitoru   If you need a refill on your cardiac medications before your next appointment, please call your pharmacy.

## 2017-03-08 LAB — BASIC METABOLIC PANEL
BUN/Creatinine Ratio: 16 (ref 12–28)
BUN: 27 mg/dL (ref 8–27)
CO2: 32 mmol/L — ABNORMAL HIGH (ref 20–29)
Calcium: 9.7 mg/dL (ref 8.7–10.3)
Chloride: 95 mmol/L — ABNORMAL LOW (ref 96–106)
Creatinine, Ser: 1.67 mg/dL — ABNORMAL HIGH (ref 0.57–1.00)
GFR calc Af Amer: 31 mL/min/{1.73_m2} — ABNORMAL LOW (ref >59)
GFR calc non Af Amer: 27 mL/min/{1.73_m2} — ABNORMAL LOW (ref >59)
Glucose: 132 mg/dL — ABNORMAL HIGH (ref 65–99)
Potassium: 4.8 mmol/L (ref 3.5–5.2)
Sodium: 142 mmol/L (ref 134–144)

## 2017-05-01 ENCOUNTER — Ambulatory Visit (INDEPENDENT_AMBULATORY_CARE_PROVIDER_SITE_OTHER): Payer: Medicare HMO | Admitting: Cardiovascular Disease

## 2017-05-01 VITALS — BP 123/70 | HR 71 | Ht 66.0 in | Wt 216.0 lb

## 2017-05-01 DIAGNOSIS — E785 Hyperlipidemia, unspecified: Secondary | ICD-10-CM | POA: Diagnosis not present

## 2017-05-01 DIAGNOSIS — I10 Essential (primary) hypertension: Secondary | ICD-10-CM | POA: Diagnosis not present

## 2017-05-01 DIAGNOSIS — Z79899 Other long term (current) drug therapy: Secondary | ICD-10-CM | POA: Diagnosis not present

## 2017-05-01 DIAGNOSIS — I25110 Atherosclerotic heart disease of native coronary artery with unstable angina pectoris: Secondary | ICD-10-CM | POA: Diagnosis not present

## 2017-05-01 DIAGNOSIS — E669 Obesity, unspecified: Secondary | ICD-10-CM

## 2017-05-01 DIAGNOSIS — E1122 Type 2 diabetes mellitus with diabetic chronic kidney disease: Secondary | ICD-10-CM | POA: Diagnosis not present

## 2017-05-01 DIAGNOSIS — N183 Chronic kidney disease, stage 3 (moderate): Secondary | ICD-10-CM | POA: Diagnosis not present

## 2017-05-01 DIAGNOSIS — I129 Hypertensive chronic kidney disease with stage 1 through stage 4 chronic kidney disease, or unspecified chronic kidney disease: Secondary | ICD-10-CM | POA: Diagnosis not present

## 2017-05-01 DIAGNOSIS — I5042 Chronic combined systolic (congestive) and diastolic (congestive) heart failure: Secondary | ICD-10-CM | POA: Diagnosis not present

## 2017-05-01 MED ORDER — NITROGLYCERIN 0.4 MG SL SUBL: 0.4000 mg | SUBLINGUAL_TABLET | SUBLINGUAL | 4 refills | Status: AC | PRN

## 2017-05-01 MED ORDER — PRAVASTATIN SODIUM 40 MG PO TABS: 40.0000 mg | ORAL_TABLET | Freq: Every evening | ORAL | 3 refills | Status: DC

## 2017-05-01 NOTE — Progress Notes (Signed)
Cardiology Office Note:    Date:  05/02/2017   ID:  KAGAN HIETPAS, DOB [birth year], MRN [id number]  PCP:  Olive Bass, MD  Cardiologist:  Thurmon Fair, MD    Referring MD: Olive Bass, MD   Chief Complaint  Patient presents with  . Follow-up  CAD, CHF  History of Present Illness:    Meagan Roberts is a 83 y.o. female with a hx of Severe multivessel CAD, not amenable for either percutaneous or surgical revascularization, by cardiac catheterization in July 2018, when she presented with a small non-STEMI.. She also has mild ischemic cardiomyopathy with EF of approximately 40%. 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 She has a history of intolerance to atorvastatin due to myalgia. She has chronic respiratory insufficiency with hypoxemia and is on home oxygen.  She is wearing oxygen when she walks, but most of the time at home does not feel like she needs the oxygen. She denies orthopnea or PND. She does not have chest discomfort when walking around the house, but does develop midsternal chest discomfort if she does "more than usual" such as trying to exercise. She has chronic leg edema, but it is less than in the past. She denies claudication, dizziness or syncope. She has not had any bleeding problems. She reports that her blood sugar is "up and down".  Her weight is 12 pounds lower than it was at hospital discharge in July. She still has some ankle swelling.  Past Medical History:  Diagnosis Date  . Arthritis   . Asthma   . CHF (congestive heart failure) (HCC)   . CKD (chronic kidney disease), stage III   . Depression   . Diabetes mellitus without complication (HCC)   . Hypertension   . MI (myocardial infarction) (HCC)   . Pneumonia 02/2017  . Stroke [facility])     Past Surgical History:  Procedure Laterality Date  . ABDOMINAL HYSTERECTOMY    . CATARACT EXTRACTION     . LEFT HEART CATH AND CORONARY ANGIOGRAPHY N/A 02/15/2017   Procedure: Left Heart Cath and Coronary Angiography;  Surgeon: Iran Ouch, MD;  Location: [facility] INVASIVE CV LAB;  Service: Cardiovascular;  Laterality: N/A;    Current Medications: Current Meds  Medication Sig  . Amino Acids-Protein Hydrolys (FEEDING SUPPLEMENT, PRO-STAT SUGAR FREE 64,) LIQD Take 30 mLs by mouth 2 (two) times daily.  Marland Kitchen amLODipine (NORVASC) 10 MG tablet Take 1 tablet (10 mg total) by mouth daily.  Marland Kitchen aspirin 81 MG EC tablet Take 1 tablet (81 mg total) by mouth daily.  . carvedilol (COREG) 12.5 MG tablet Take 1 tablet (12.5 mg total) by mouth 2 (two) times daily with a meal.  . clopidogrel (PLAVIX) 75 MG tablet Take 75 mg by mouth daily.   . colchicine 0.6 MG tablet Take 0.6 mg by mouth daily.   . diclofenac sodium (VOLTAREN) 1 % GEL Use as directed  . ezetimibe (ZETIA) 10 MG tablet Take 1 tablet by mouth daily.  . feeding supplement, ENSURE ENLIVE, (ENSURE ENLIVE) LIQD Take 237 mLs by mouth 2 (two) times daily between meals.  . furosemide (LASIX) 20 MG tablet Take 1 tablet by mouth daily.  Marland Kitchen gabapentin (NEURONTIN) 300 MG capsule Take 300 mg by mouth at bedtime.   . hydrALAZINE (APRESOLINE) 25 MG tablet Take 1.5 tablets (37.5 mg total) by mouth 3 (three) times daily.  . insulin aspart (NOVOLOG) 100  UNIT/ML injection Inject 0-20 Units into the skin 3 (three) times daily with meals.  . insulin glargine (LANTUS) 100 UNIT/ML injection Inject 0.25 mLs (25 Units total) into the skin daily.  Marland Kitchen isoniazid (NYDRAZID) 300 MG tablet Take 1 tablet by mouth daily.  . isosorbide mononitrate (IMDUR) 60 MG 24 hr tablet Take 1 tablet (60 mg total) by mouth daily.  . nitroGLYCERIN (NITROSTAT) 0.4 MG SL tablet Place 1 tablet (0.4 mg total) under the tongue every 5 (five) minutes x 3 doses as needed for chest pain.  . pantoprazole (PROTONIX) 40 MG tablet Take 40 mg by mouth 2 (two) times daily.  . polyethylene glycol (MIRALAX /  GLYCOLAX) packet Take 17 g by mouth daily.  . prednisoLONE acetate (PRED FORTE) 1 % ophthalmic suspension Place 1 drop into the right eye 2 (two) times daily.   . traZODone (DESYREL) 50 MG tablet Take 50 mg by mouth at bedtime.  . vitamin C (ASCORBIC ACID) 500 MG tablet Take 500 mg by mouth daily.  Marland Kitchen zinc sulfate 220 (50 Zn) MG capsule Take 220 mg by mouth daily.  . [DISCONTINUED] nitroGLYCERIN (NITROSTAT) 0.4 MG SL tablet Place 1 tablet (0.4 mg total) under the tongue every 5 (five) minutes x 3 doses as needed for chest pain.     Allergies:   Atorvastatin; Penicillins; and Sulfur   Social History   Social History  . Marital status: Divorced    Spouse name: N/A  . Number of children: N/A  . Years of education: N/A   Social History Main Topics  . Smoking status: Never Smoker  . Smokeless tobacco: Never Used  . Alcohol use No  . Drug use: No  . Sexual activity: Not on file   Other Topics Concern  . Not on file   Social History Narrative  . No narrative on file     Family History: The patient's family history includes Hypertension in her father and mother. ROS:   Please see the history of present illness.     All other systems reviewed and are negative.  EKGs/Labs/Other Studies Reviewed:    EKG:  EKG is not ordered today.   Recent Labs: 02/13/2017: B Natriuretic Peptide 219.7 02/16/2017: Hemoglobin 11.9; Platelets 307 02/17/2017: Magnesium 1.5 03/07/2017: BUN 27; Creatinine, Ser 1.67; Potassium 4.8; Sodium 142  Recent Lipid Panel No results found for: CHOL, TRIG, HDL, CHOLHDL, VLDL, LDLCALC, LDLDIRECT  Physical Exam:    VS:  BP 123/70   Pulse 71   Ht  (1.676 m)   Wt 216 lb (98 kg)   SpO2 94%   BMI 34.86 kg/m     Wt Readings from Last 3 Encounters:  05/01/17 216 lb (98 kg)  03/07/17 222 lb (100.7 kg)  02/20/17 228 lb 9.6 oz (103.7 kg)     GEN: Obese, wearing oxygen, Well nourished, well developed in no acute distress HEENT: Normal NECK: Difficult to  evaluate JVD; No carotid bruits LYMPHATICS: No lymphadenopathy CARDIAC: RRR, no murmurs, rubs, gallops RESPIRATORY:  Clear to auscultation without rales, wheezing or rhonchi  ABDOMEN: Soft, non-tender, non-distended MUSCULOSKELETAL:  1+ symmetrical ankle edema; No deformity  SKIN: Warm and dry NEUROLOGIC:  Alert and oriented x 3 PSYCHIATRIC:  Normal affect   ASSESSMENT:    1. CAD of native heart with unstable angina pectoris (HCC)   2. Chronic combined systolic and diastolic heart failure (HCC)   3. Dyslipidemia   4. Essential hypertension   5. Obesity (BMI 30-39.9)   6. Type  2 DM with CKD stage 3 and hypertension (HCC)   7. Medication management    PLAN:    In order of problems listed above:  1. CAD: She has CCS class II stable angina pectoris. She is already on a calcium channel blocker, beta blocker. OK to use sublingual nitroglycerin. Will add a long-acting nitrate. Continue aspirin clopidogrel. Not a revascularization candidate. 2. CHF: Steadily losing weight since hospital discharge and less edema, improving creatinine, still probably mildly hypervolemic, but no reason to rush the diuresis. Not receiving RAAS inhibitors due to volatile renal function. 3. HLP: On ezetimibe monotherapy which is insufficient; will try pravastatin and recheck labs in 30 days. 4. HTN:  Controlled 5. Obesity: Respiratory insufficiency is likely multifactorial, suspect a component of obesity hypoventilation syndrome. 6. CKD 3: Creatinine has improved compared to hospital discharge. GFR around 30. 7. DM: Requiring insulin   Medication Adjustments/Labs and Tests Ordered: Current medicines are reviewed at length with the patient today.  Concerns regarding medicines are outlined above.  Orders Placed This Encounter  Procedures  . Lipid panel  . Comprehensive metabolic panel   Meds ordered this encounter  Medications  . pravastatin (PRAVACHOL) 40 MG tablet    Sig: Take 1 tablet (40 mg total) by  mouth every evening.    Dispense:  90 tablet    Refill:  3  . nitroGLYCERIN (NITROSTAT) 0.4 MG SL tablet    Sig: Place 1 tablet (0.4 mg total) under the tongue every 5 (five) minutes x 3 doses as needed for chest pain.    Dispense:  25 tablet    Refill:  4    Signed, Thurmon Fair, MD  05/02/2017 12:28 PM    [facility]

## 2017-05-01 NOTE — Patient Instructions (Signed)
Medication Instructions: Dr Royann Shivers has recommended making the following medication changes: 1. START Pravastatin 40 mg - take 1 tablet by mouth daily  Labwork: Your physician recommends that you return for lab work in 8 weeks - FASTING.  Testing/Procedures: NONE ORDERED  Follow-up: Dr Royann Shivers recommends that you schedule a follow-up appointment in 6 months. You will receive a reminder letter in the mail two months in advance. If you don't receive a letter, please call our office to schedule the follow-up appointment.  If you need a refill on your cardiac medications before your next appointment, please call your pharmacy.

## 2017-05-02 ENCOUNTER — Encounter: Payer: Self-pay | Admitting: Cardiovascular Disease

## 2017-05-18 ENCOUNTER — Institutional Professional Consult (permissible substitution): Payer: Medicare HMO | Admitting: Pulmonary Disease

## 2017-06-15 ENCOUNTER — Telehealth: Payer: Self-pay | Admitting: Cardiovascular Disease

## 2017-06-15 NOTE — Telephone Encounter (Signed)
New Message     Pt daughter states that she was suppose to be contacted about having some testing done in [place] and she has not heard anything

## 2017-06-15 NOTE — Telephone Encounter (Signed)
Returned call to Meagan Roberts (daughter), informed to just go to [facility] in [place], she will have them call if anything is needed

## 2017-07-11 ENCOUNTER — Encounter: Payer: Self-pay | Admitting: Internal Medicine

## 2017-07-11 ENCOUNTER — Ambulatory Visit (INDEPENDENT_AMBULATORY_CARE_PROVIDER_SITE_OTHER): Payer: Medicare HMO | Admitting: Internal Medicine

## 2017-07-11 VITALS — BP 126/80 | HR 71 | Wt 214.4 lb

## 2017-07-11 DIAGNOSIS — R0609 Other forms of dyspnea: Secondary | ICD-10-CM | POA: Diagnosis not present

## 2017-07-11 DIAGNOSIS — G4733 Obstructive sleep apnea (adult) (pediatric): Secondary | ICD-10-CM

## 2017-07-11 DIAGNOSIS — E669 Obesity, unspecified: Secondary | ICD-10-CM | POA: Diagnosis not present

## 2017-07-11 DIAGNOSIS — R0683 Snoring: Secondary | ICD-10-CM | POA: Diagnosis not present

## 2017-07-11 NOTE — Patient Instructions (Signed)
Order- schedule home sleep test dx OSA  Please call me for results and recommendation about 2 weeks after your sleep test.   If we can't arrange home sleep test, then it may work best if Dr Sol Passerough can get an overnight sleep test done in [place], where you live.

## 2017-07-11 NOTE — Assessment & Plan Note (Signed)
Reported witnessed apneas and snoring in hospital.  Compatible physical exam.  She is significantly limited physically and I think we can get the information we need from a home sleep study if insurance agrees.  Otherwise, it may be better if her primary physician can arrange for split protocol sleep study in [place]. -Plan-schedule sleep study

## 2017-07-11 NOTE — Assessment & Plan Note (Signed)
She is an obese diabetic with multiple morbidities which would be improved if she could accomplish weight loss.  Realistically, diet and lifestyle change would have to be the available method.

## 2017-07-11 NOTE — Assessment & Plan Note (Signed)
She may have a component of obesity hypoventilation.  Congestive heart failure seems under control at this visit.  And deconditioning will certainly limit activity but her eyesight is so poor that sustained exertion is not expected.

## 2017-07-11 NOTE — Progress Notes (Signed)
07/11/17-83 year old female never smoker for sleep evaluation. Medical problem list includes CHF, CAD/ MI, LBBB, CKD, DM 2, HBP, obesity, CVA, nearly blind Sleep Consult per Hospital D/C paperwork Hospitalized in July with NSTEMI,, CHF, acute on chronic respiratory failure/hypoxia, pulmonary edema, HCAP,  O2 2 L sleep and as needed/[employer]    She lives in [place] with 1 daughter but a different daughter came with her today. She was told in hospital that she was snoring and had witnessed sleep apnea.  Frequently at night but denies daytime sleepiness. Unclear about limb movement.  No ENT surgery.  Prior to Admission medications   Medication Sig Start Date End Date Taking? Authorizing Provider  amitriptyline (ELAVIL) 25 MG tablet Take 25 mg by mouth at bedtime.   Yes [provider]  amLODipine-olmesartan (AZOR) 10-40 MG tablet Take 1 tablet by mouth daily.   Yes [provider]  aspirin 81 MG EC tablet Take 1 tablet (81 mg total) by mouth daily. 02/21/17  Yes Berton Bon, NP  atropine 1 % ophthalmic solution Place 1 drop into both eyes 3 (three) times daily.   Yes [provider]  Calcium Carb-Cholecalciferol (CALCIUM-VITAMIN D) 500-200 MG-UNIT tablet Take 1 tablet by mouth daily.   Yes [provider]  carvedilol (COREG) 25 MG tablet Take 25 mg by mouth daily.   Yes [provider]  clopidogrel (PLAVIX) 75 MG tablet Take 75 mg by mouth daily.  06/13/14  Yes [provider]  colchicine 0.6 MG tablet Take 0.6 mg by mouth daily.  07/03/14  Yes [provider]  Colesevelam HCl 3.75 g PACK Take by mouth.   Yes [provider]  furosemide (LASIX) 40 MG tablet Take 40 mg by mouth daily.   Yes [provider]  insulin aspart (NOVOLOG) 100 UNIT/ML injection Inject 0-20 Units into the skin 3 (three) times daily with meals. 02/20/17  Yes Berton Bon, NP  insulin glargine (LANTUS) 100 UNIT/ML injection Inject 0.25 mLs (25  Units total) into the skin daily. 02/21/17  Yes Berton Bon, NP  lidocaine (LIDODERM) 5 % Place 1 patch onto the skin daily. Remove & Discard patch within 12 hours or as directed by MD   Yes [provider]  nitroGLYCERIN (NITROSTAT) 0.4 MG SL tablet Place 1 tablet (0.4 mg total) under the tongue every 5 (five) minutes x 3 doses as needed for chest pain. 05/01/17  Yes Croitoru, Mihai, MD  pantoprazole (PROTONIX) 40 MG tablet Take 40 mg by mouth 2 (two) times daily. 12/17/16  Yes [provider]  prednisoLONE acetate (PRED FORTE) 1 % ophthalmic suspension Place 1 drop into the right eye 3 (three) times daily.  07/14/14  Yes [provider]  traMADol (ULTRAM) 50 MG tablet Take 50 mg by mouth daily.   Yes [provider]  traZODone (DESYREL) 50 MG tablet Take 50 mg by mouth at bedtime. 11/16/16  Yes [provider]   Past Medical History:  Diagnosis Date  . Arthritis   . Asthma   . CHF (congestive heart failure) (HCC)   . CKD (chronic kidney disease), stage III (HCC)   . Depression   . Diabetes mellitus without complication (HCC)   . Hypertension   . MI (myocardial infarction) (HCC)   . Pneumonia 02/2017  . Stroke [facility])    Past Surgical History:  Procedure Laterality Date  . ABDOMINAL HYSTERECTOMY    . CATARACT EXTRACTION    . LEFT HEART CATH AND CORONARY ANGIOGRAPHY N/A 02/15/2017  Procedure: Left Heart Cath and Coronary Angiography;  Surgeon: Iran OuchArida, Muhammad A, MD;  Location: [facility] INVASIVE CV LAB;  Service: Cardiovascular;  Laterality: N/A;   Family History  Problem Relation Age of Onset  . Hypertension Mother   . Hypertension Father    Social History   Socioeconomic History  . Marital status: Divorced    Spouse name: Not on file  . Number of children: Not on file  . Years of education: Not on file  . Highest education level: Not on file  Social Needs  . Financial resource strain: Not on file  . Food insecurity - worry: Not on file   . Food insecurity - inability: Not on file  . Transportation needs - medical: Not on file  . Transportation needs - non-medical: Not on file  Occupational History  . Not on file  Tobacco Use  . Smoking status: Never Smoker  . Smokeless tobacco: Never Used  Substance and Sexual Activity  . Alcohol use: No  . Drug use: No  . Sexual activity: Not on file  Other Topics Concern  . Not on file  Social History Narrative  . Not on file   ROS-see HPI   + = positive Constitutional:    weight loss, night sweats, fevers, chills, fatigue, lassitude. HEENT:    headaches, difficulty swallowing, tooth/dental problems, sore throat,       sneezing, itching, ear ache, nasal congestion, post nasal drip, snoring CV:    chest pain, orthopnea, PND, swelling in lower extremities, anasarca,                                                dizziness, palpitations Resp:   shortness of breath with exertion or at rest.                productive cough,   non-productive cough, coughing up of blood.              change in color of mucus.  wheezing.   Skin:    rash or lesions. GI:  No-   heartburn, indigestion, abdominal pain, nausea, vomiting, diarrhea,                 change in bowel habits, loss of appetite GU: dysuria, change in color of urine, no urgency or frequency.   flank pain. MS:   joint pain, stiffness, decreased range of motion, back pain. Neuro-     nothing unusual Psych:  change in mood or affect.  depression or anxiety.   memory loss.  OBJ- Physical Exam  'left oxygen at home' General- Alert, Oriented, Affect-appropriate, Distress- none acute + obese Skin- rash-none, lesions- none, excoriation- none Lymphadenopathy- none Head- atraumatic            Eyes- Gross vision severely limited, dense right cataract            Ears- Hearing, canals-normal            Nose- Clear, no-Septal dev, mucus, polyps, erosion, perforation             Throat- Mallampati IV , mucosa clear , drainage- none, tonsils-  atrophic + dentures                     neck- flexible , trachea midline, no stridor , thyroid nl, carotid no bruit Chest -  symmetrical excursion , unlabored           Heart/CV- RRR , no murmur , no gallop  , no rub, nl s1 s2                           - JVD- none , edema- none, stasis changes- none, varices- none           Lung- clear to P&A, wheeze- none, cough- none , dullness-none, rub- none           Chest wall-  Abd-  Br/ Gen/ Rectal- Not done, not indicated Extrem- cyanosis- none, clubbing, none, atrophy- none, strength- nl and rolling walker Neuro- grossly intact to observation

## 2017-07-20 ENCOUNTER — Telehealth: Payer: Self-pay | Admitting: Internal Medicine

## 2017-07-20 NOTE — Telephone Encounter (Signed)
Spoke with patient's daughter and explained the concept about a HST. She verbalized understanding. Nothing else needed at time of call.

## 2017-07-20 NOTE — Telephone Encounter (Signed)
Instructions      Return in about 3 months (around 10/09/2017).  Order- schedule home sleep test dx OSA  Please call me for results and recommendation about 2 weeks after your sleep test.   If we can't arrange home sleep test, then it may work best if Dr Sol Passerough can get an overnight sleep test done in [place] where you live.     After Visit Summary (Printed 07/11/2017)   ATC [place], no answer. Left message for pt to call back.  `

## 2017-08-15 ENCOUNTER — Ambulatory Visit (INDEPENDENT_AMBULATORY_CARE_PROVIDER_SITE_OTHER): Payer: Medicare HMO | Admitting: Podiatry

## 2017-08-15 DIAGNOSIS — E1151 Type 2 diabetes mellitus with diabetic peripheral angiopathy without gangrene: Secondary | ICD-10-CM

## 2017-08-15 DIAGNOSIS — B351 Tinea unguium: Secondary | ICD-10-CM | POA: Diagnosis not present

## 2017-08-15 DIAGNOSIS — E119 Type 2 diabetes mellitus without complications: Secondary | ICD-10-CM | POA: Diagnosis not present

## 2017-08-18 ENCOUNTER — Other Ambulatory Visit: Payer: Self-pay | Admitting: Internal Medicine

## 2017-08-18 DIAGNOSIS — G4733 Obstructive sleep apnea (adult) (pediatric): Secondary | ICD-10-CM

## 2017-08-21 ENCOUNTER — Other Ambulatory Visit: Payer: Self-pay | Admitting: Cardiovascular Disease

## 2017-09-07 NOTE — Progress Notes (Signed)
Subjective:  Patient ID: Meagan HartMary F Figley, female    DOB: 05/11/1927,  MRN: [id number]  Chief Complaint  Patient presents with  . Diabetes    diabetic foot exam   . Nail Problem    nail care  does not want the nails trimmed too short Dr Hal Neerillis did that last time they got infected and it took her all summer to get them healed   84 y.o. female returns for diabetic foot care.  Did not check her sugars this morning.  Yesterday it was 143.  States her PCP is Dr. Sol Passerough last saw him last week  Past Medical History:  Diagnosis Date  . Arthritis   . Asthma   . CHF (congestive heart failure) (HCC)   . CKD (chronic kidney disease), stage III (HCC)   . Depression   . Diabetes mellitus without complication (HCC)   . Hypertension   . MI (myocardial infarction) (HCC)   . Pneumonia 02/2017  . Stroke [facility](H[facility])    Past Surgical History:  Procedure Laterality Date  . ABDOMINAL HYSTERECTOMY    . CATARACT EXTRACTION    . LEFT HEART CATH AND CORONARY ANGIOGRAPHY N/A 02/15/2017   Procedure: Left Heart Cath and Coronary Angiography;  Surgeon: Iran OuchArida, Muhammad A, MD;  [facility] INVASIVE CV LAB;  Service: Cardiovascular;  Laterality: N/A;    Current Outpatient Medications:  .  amitriptyline (ELAVIL) 25 MG tablet, Take 25 mg by mouth at bedtime., Disp: , Rfl:  .  amLODipine-olmesartan (AZOR) 10-40 MG tablet, Take 1 tablet by mouth daily., Disp: , Rfl:  .  aspirin 81 MG EC tablet, Take 1 tablet (81 mg total) by mouth daily., Disp: 30 tablet, Rfl: 11 .  atropine 1 % ophthalmic solution, Place 1 drop into both eyes 3 (three) times daily., Disp: , Rfl:  .  Calcium Carb-Cholecalciferol (CALCIUM-VITAMIN D) 500-200 MG-UNIT tablet, Take 1 tablet by mouth daily., Disp: , Rfl:  .  carvedilol (COREG) 25 MG tablet, Take 25 mg by mouth daily., Disp: , Rfl:  .  clopidogrel (PLAVIX) 75 MG tablet, Take 75 mg by mouth daily. , Disp: , Rfl: 0 .  colchicine 0.6 MG tablet, Take 0.6 mg by mouth daily. , Disp: , Rfl: 0 .   Colesevelam HCl 3.75 g PACK, Take by mouth., Disp: , Rfl:  .  insulin aspart (NOVOLOG) 100 UNIT/ML injection, Inject 0-20 Units into the skin 3 (three) times daily with meals., Disp: 10 mL, Rfl: 11 .  insulin glargine (LANTUS) 100 UNIT/ML injection, Inject 0.25 mLs (25 Units total) into the skin daily., Disp: 10 mL, Rfl: 11 .  lidocaine (LIDODERM) 5 %, Place 1 patch onto the skin daily. Remove & Discard patch within 12 hours or as directed by MD, Disp: , Rfl:  .  nitroGLYCERIN (NITROSTAT) 0.4 MG SL tablet, Place 1 tablet (0.4 mg total) under the tongue every 5 (five) minutes x 3 doses as needed for chest pain., Disp: 25 tablet, Rfl: 4 .  pantoprazole (PROTONIX) 40 MG tablet, Take 40 mg by mouth 2 (two) times daily., Disp: , Rfl: 0 .  prednisoLONE acetate (PRED FORTE) 1 % ophthalmic suspension, Place 1 drop into the right eye 3 (three) times daily. , Disp: , Rfl: 0 .  traMADol (ULTRAM) 50 MG tablet, Take 50 mg by mouth daily., Disp: , Rfl:  .  traZODone (DESYREL) 50 MG tablet, Take 50 mg by mouth at bedtime., Disp: , Rfl: 0 .  furosemide (LASIX) 20 MG tablet, take 1 tablet  by mouth daily for FLUID, Disp: 90 tablet, Rfl: 1  Allergies  Allergen Reactions  . Atorvastatin Other (See Comments)    Other reaction(s): Myalgias (intolerance)  . Penicillins Itching and Rash    Family members do not know the specifics  . Sulfur Itching and Rash    ITCHING & RASH     Objective:   General AA&O x3. Normal mood and affect.  Vascular Dorsalis pedis pulses present 1+ bilaterally  Posterior tibial pulses absent bilaterally  Capillary refill normal to all digits. Pedal hair growth normal.  Neurologic Epicritic sensation present bilaterally. Protective sensation with 5.07 monofilament  present bilaterally. Vibratory sensation present bilaterally.  Dermatologic No open lesions. Interspaces clear of maceration.  Normal skin temperature and turgor. Hyperkeratotic lesions: None bilaterally. Nails: brittle,  onychomycosis, thickening, elongation  Orthopedic: No history of amputation. MMT 5/5 in dorsiflexion, plantarflexion, inversion, and eversion. Normal lower extremity joint ROM without pain or crepitus.   Assessment & Plan:  Patient was evaluated and treated and all questions answered.  Diabetes with PAD, Onychomycosis -Educated on diabetic footcare. Diabetic risk level 1 -At risk foot care provided as below.  Procedure: Nail Debridement Rationale: Patient meets criteria for routine foot care due to class B findings Type of Debridement: manual, sharp debridement. Instrumentation: Nail nipper, rotary burr. Number of Nails: 10  Return in about 3 months (around 11/13/2017) for Routine Foot Care.

## 2017-09-20 ENCOUNTER — Telehealth: Payer: Self-pay | Admitting: *Deleted

## 2017-09-20 NOTE — Telephone Encounter (Signed)
Incoming call from [facility] office. The patient is having a cleaning and impression done for upper plates. They would like to know if the patient needs to hold the Plavix and if so how long. They have tried calling the PCP and were told to use their discretion. They would like verification from cardiology.   Aleda GranaYasmine can be reached at [phone].

## 2017-09-21 NOTE — Telephone Encounter (Signed)
She does not need to hold plavix for cleaning and impressions [facility]

## 2017-09-21 NOTE — Telephone Encounter (Signed)
Meagan Roberts at [facility] notified faxed via Epic function as requested

## 2017-10-06 ENCOUNTER — Telehealth: Payer: Self-pay | Admitting: Internal Medicine

## 2017-10-06 NOTE — Telephone Encounter (Signed)
I called her daughter to ask her what physician wrote the original Rx but in Epic it looks like cardiology wrote this Rx. I advised her I would send the message to them to see if the patient is still on this medication and if so, request a refill.   Please call her daughter Meagan Roberts to let her know if B. Bhagat can refill this.  Thank you  Current Outpatient Medications on File Prior to Visit  Medication Sig Dispense Refill  . amitriptyline (ELAVIL) 25 MG tablet Take 25 mg by mouth at bedtime.    Marland Kitchen. amLODipine-olmesartan (AZOR) 10-40 MG tablet Take 1 tablet by mouth daily.    Marland Kitchen. aspirin 81 MG EC tablet Take 1 tablet (81 mg total) by mouth daily. 30 tablet 11  . atropine 1 % ophthalmic solution Place 1 drop into both eyes 3 (three) times daily.    . Calcium Carb-Cholecalciferol (CALCIUM-VITAMIN D) 500-200 MG-UNIT tablet Take 1 tablet by mouth daily.    . carvedilol (COREG) 25 MG tablet Take 25 mg by mouth daily.    . clopidogrel (PLAVIX) 75 MG tablet Take 75 mg by mouth daily.   0  . colchicine 0.6 MG tablet Take 0.6 mg by mouth daily.   0  . Colesevelam HCl 3.75 g PACK Take by mouth.    . furosemide (LASIX) 20 MG tablet take 1 tablet by mouth daily for FLUID 90 tablet 1  . insulin aspart (NOVOLOG) 100 UNIT/ML injection Inject 0-20 Units into the skin 3 (three) times daily with meals. 10 mL 11  . insulin glargine (LANTUS) 100 UNIT/ML injection Inject 0.25 mLs (25 Units total) into the skin daily. 10 mL 11  . lidocaine (LIDODERM) 5 % Place 1 patch onto the skin daily. Remove & Discard patch within 12 hours or as directed by MD    . nitroGLYCERIN (NITROSTAT) 0.4 MG SL tablet Place 1 tablet (0.4 mg total) under the tongue every 5 (five) minutes x 3 doses as needed for chest pain. 25 tablet 4  . pantoprazole (PROTONIX) 40 MG tablet Take 40 mg by mouth 2 (two) times daily.  0  . prednisoLONE acetate (PRED FORTE) 1 % ophthalmic suspension Place 1 drop into the right eye 3 (three) times daily.   0  .  traMADol (ULTRAM) 50 MG tablet Take 50 mg by mouth daily.    . traZODone (DESYREL) 50 MG tablet Take 50 mg by mouth at bedtime.  0   No current facility-administered medications on file prior to visit.    Allergies  Allergen Reactions  . Atorvastatin Other (See Comments)    Other reaction(s): Myalgias (intolerance)  . Penicillins Itching and Rash    Family members do not know the specifics  . Sulfur Itching and Rash    ITCHING & RASH

## 2017-10-06 NOTE — Telephone Encounter (Signed)
Ask patient what current dose of medication on bottle and check BP. If normal BP, continue current medications. Bring all medication during follow up. Please review with attending provider. I have seen this patient.

## 2017-10-06 NOTE — Telephone Encounter (Signed)
Spoke with Corrie DandyMary and advised her message. She understood and will get somebody to take her blood pressure. She has an appt with CY so she will make sure she brings all her medications. Nothing further is needed.

## 2017-10-10 ENCOUNTER — Encounter (HOSPITAL_COMMUNITY): Payer: Self-pay

## 2017-10-10 ENCOUNTER — Other Ambulatory Visit: Payer: Self-pay

## 2017-10-10 ENCOUNTER — Emergency Department (HOSPITAL_COMMUNITY): Payer: Medicare HMO

## 2017-10-10 ENCOUNTER — Ambulatory Visit: Payer: Medicare HMO | Admitting: Internal Medicine

## 2017-10-10 ENCOUNTER — Emergency Department (HOSPITAL_COMMUNITY)
Admission: EM | Admit: 2017-10-10 | Discharge: 2017-10-10 | Disposition: A | Discharge status: Home / Self Care | Payer: Medicare HMO | Attending: Physician Assistant | Admitting: Physician Assistant

## 2017-10-10 DIAGNOSIS — I130 Hypertensive heart and chronic kidney disease with heart failure and stage 1 through stage 4 chronic kidney disease, or unspecified chronic kidney disease: Secondary | ICD-10-CM | POA: Insufficient documentation

## 2017-10-10 DIAGNOSIS — Z79899 Other long term (current) drug therapy: Secondary | ICD-10-CM | POA: Insufficient documentation

## 2017-10-10 DIAGNOSIS — E1122 Type 2 diabetes mellitus with diabetic chronic kidney disease: Secondary | ICD-10-CM | POA: Diagnosis not present

## 2017-10-10 DIAGNOSIS — M62838 Other muscle spasm: Secondary | ICD-10-CM | POA: Insufficient documentation

## 2017-10-10 DIAGNOSIS — Z7982 Long term (current) use of aspirin: Secondary | ICD-10-CM | POA: Diagnosis not present

## 2017-10-10 DIAGNOSIS — Z794 Long term (current) use of insulin: Secondary | ICD-10-CM | POA: Insufficient documentation

## 2017-10-10 DIAGNOSIS — Z7902 Long term (current) use of antithrombotics/antiplatelets: Secondary | ICD-10-CM | POA: Diagnosis not present

## 2017-10-10 DIAGNOSIS — M542 Cervicalgia: Secondary | ICD-10-CM | POA: Diagnosis present

## 2017-10-10 DIAGNOSIS — I2510 Atherosclerotic heart disease of native coronary artery without angina pectoris: Secondary | ICD-10-CM | POA: Diagnosis not present

## 2017-10-10 DIAGNOSIS — I252 Old myocardial infarction: Secondary | ICD-10-CM | POA: Diagnosis not present

## 2017-10-10 DIAGNOSIS — I5042 Chronic combined systolic (congestive) and diastolic (congestive) heart failure: Secondary | ICD-10-CM | POA: Insufficient documentation

## 2017-10-10 DIAGNOSIS — J45909 Unspecified asthma, uncomplicated: Secondary | ICD-10-CM | POA: Insufficient documentation

## 2017-10-10 DIAGNOSIS — N183 Chronic kidney disease, stage 3 (moderate): Secondary | ICD-10-CM | POA: Insufficient documentation

## 2017-10-10 DIAGNOSIS — Z8673 Personal history of transient ischemic attack (TIA), and cerebral infarction without residual deficits: Secondary | ICD-10-CM | POA: Diagnosis not present

## 2017-10-10 LAB — BASIC METABOLIC PANEL
Anion gap: 8 (ref 5–15)
BUN: 10 mg/dL (ref 6–20)
CALCIUM: 8.9 mg/dL (ref 8.9–10.3)
CO2: 31 mmol/L (ref 22–32)
CREATININE: 1.25 mg/dL — AB (ref 0.44–1.00)
Chloride: 100 mmol/L — ABNORMAL LOW (ref 101–111)
GFR calc non Af Amer: 37 mL/min — ABNORMAL LOW (ref >60)
GFR, EST AFRICAN AMERICAN: 43 mL/min — AB (ref >60)
GLUCOSE: 243 mg/dL — AB (ref 65–99)
Potassium: 3.6 mmol/L (ref 3.5–5.1)
Sodium: 139 mmol/L (ref 135–145)

## 2017-10-10 LAB — CBC
HCT: 37.6 % (ref 36.0–46.0)
Hemoglobin: 12 g/dL (ref 12.0–15.0)
MCH: 26 pg (ref 26.0–34.0)
MCHC: 31.9 g/dL (ref 30.0–36.0)
MCV: 81.6 fL (ref 78.0–100.0)
PLATELETS: 216 10*3/uL (ref 150–400)
RBC: 4.61 MIL/uL (ref 3.87–5.11)
RDW: 16.6 % — AB (ref 11.5–15.5)
WBC: 7.9 10*3/uL (ref 4.0–10.5)

## 2017-10-10 LAB — I-STAT TROPONIN, ED
TROPONIN I, POC: 0 ng/mL (ref 0.00–0.08)
Troponin i, poc: 0 ng/mL (ref 0.00–0.08)

## 2017-10-10 MED ORDER — CYCLOBENZAPRINE HCL 5 MG PO TABS: 5.0000 mg | ORAL_TABLET | Freq: Two times a day (BID) | ORAL | 0 refills | Status: DC | PRN

## 2017-10-10 MED ORDER — CYCLOBENZAPRINE HCL 10 MG PO TABS
5.0000 mg | ORAL_TABLET | Freq: Once | ORAL | Status: AC
  Administered 2017-10-10: 5 mg via ORAL
  Filled 2017-10-10: qty 1

## 2017-10-10 MED ORDER — ACETAMINOPHEN 325 MG PO TABS
650.0000 mg | ORAL_TABLET | Freq: Once | ORAL | Status: AC
  Administered 2017-10-10: 650 mg via ORAL
  Filled 2017-10-10: qty 2

## 2017-10-10 NOTE — ED Provider Notes (Signed)
[facility] EMERGENCY DEPARTMENT Provider Note   CSN: [id number] Arrival date & time: 10/10/17  1009     History   Chief Complaint Chief Complaint  Patient presents with  . Chest Pain    HPI Meagan Roberts is a 84 y.o. female.  HPI  84 year old female presenting with muscle pain in her left neck.  Patient has past medical history significant for obesity, hypertension hyperlipidemia history of MI and stroke.  Reportedly woke from sleep and has pain in the left neck area.  She reports she had an accident there over 50 years ago and has some plates in that area.  Patient has tenderness to touch of the muscle.  Patient says it hurts when she moves her arm.  Past Medical History:  Diagnosis Date  . Arthritis   . Asthma   . CHF (congestive heart failure) (HCC)   . CKD (chronic kidney disease), stage III (HCC)   . Depression   . Diabetes mellitus without complication (HCC)   . Hypertension   . MI (myocardial infarction) (HCC)   . Pneumonia 02/2017  . Stroke [facility])     Patient Active Problem List   Diagnosis Date Noted  . Snoring 07/11/2017  . Obesity (BMI 30-39.9) 03/07/2017  . LBBB (left bundle branch block) 03/07/2017  . CAP (community acquired pneumonia) 03/07/2017  . Acute combined systolic and diastolic congestive heart failure (HCC)   . Acute on chronic respiratory failure with hypoxia (HCC)   . Insulin dependent diabetes mellitus with complications (HCC)   . Essential hypertension   . Non-ST elevation (NSTEMI) myocardial infarction (HCC)   . 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 Dyspnea   . Chronic combined systolic and diastolic heart failure (HCC)   . Hyperglycemia   . Pressure injury of skin 02/11/2017  . Chronic respiratory failure [facility])     Past Surgical History:  Procedure Laterality Date  . ABDOMINAL HYSTERECTOMY    . CATARACT EXTRACTION    . LEFT HEART CATH AND CORONARY ANGIOGRAPHY N/A 02/15/2017   Procedure:  Left Heart Cath and Coronary Angiography;  Surgeon: Iran Ouch, MD;  Location: [facility] INVASIVE CV LAB;  Service: Cardiovascular;  Laterality: N/A;    OB History    No data available       Home Medications    Prior to Admission medications   Medication Sig Start Date End Date Taking? Authorizing Provider  amLODipine (NORVASC) 10 MG tablet Take 10 mg by mouth daily.   Yes [provider]  aspirin 81 MG EC tablet Take 1 tablet (81 mg total) by mouth daily. 02/21/17  Yes Berton Bon, NP  carvedilol (COREG) 25 MG tablet Take 25 mg by mouth 2 (two) times daily with a meal.    Yes [provider]  clopidogrel (PLAVIX) 75 MG tablet Take 75 mg by mouth daily.  06/13/14  Yes [provider]  colchicine 0.6 MG tablet Take 0.6 mg by mouth daily.  07/03/14  Yes [provider]  ezetimibe (ZETIA) 10 MG tablet Take 10 mg by mouth daily. 04/04/17  Yes [provider]  furosemide (LASIX) 20 MG tablet take 1 tablet by mouth daily for FLUID 08/21/17  Yes Croitoru, Mihai, MD  gabapentin (NEURONTIN) 300 MG capsule Take 300 mg by mouth at bedtime. 10/04/17  Yes [provider]  hydrALAZINE (APRESOLINE) 25 MG tablet Take 25 mg by mouth 3 (three) times daily.  09/08/17  Yes [provider]  insulin aspart (NOVOLOG) 100 UNIT/ML injection Inject 0-20 Units into the skin 3 (three) times daily with meals. Patient taking differently: Inject 15-20 Units into the skin See admin instructions. Use 15 units at breakfast and lunch then use 20 units at supper 02/20/17  Yes Berton BonHammond, Janine, NP  insulin glargine (LANTUS) 100 UNIT/ML injection Inject 0.25 mLs (25 Units total) into the skin daily. Patient taking differently: Inject 50 Units into the skin daily.  02/21/17  Yes Berton BonHammond, Janine, NP  isosorbide mononitrate (IMDUR) 60 MG 24 hr tablet Take 60 mg by mouth daily. 09/27/17  Yes [provider]  nitroGLYCERIN (NITROSTAT) 0.4 MG SL tablet Place 1 tablet  (0.4 mg total) under the tongue every 5 (five) minutes x 3 doses as needed for chest pain. 05/01/17  Yes Croitoru, Mihai, MD  pantoprazole (PROTONIX) 40 MG tablet Take 40 mg by mouth 2 (two) times daily. 12/17/16  Yes [provider]  traZODone (DESYREL) 50 MG tablet Take 50 mg by mouth at bedtime. 11/16/16  Yes [provider]  cyclobenzaprine (FLEXERIL) 5 MG tablet Take 1 tablet (5 mg total) by mouth 2 (two) times daily as needed for muscle spasms. 10/10/17   Zakarie Sturdivant, Cindee Saltourteney Lyn, MD    Family History Family History  Problem Relation Age of Onset  . Hypertension Mother   . Hypertension Father     Social History Social History   Tobacco Use  . Smoking status: Never Smoker  . Smokeless tobacco: Never Used  Substance Use Topics  . Alcohol use: No  . Drug use: No     Allergies   Atorvastatin; Penicillins; and Sulfur   Review of Systems Review of Systems  Constitutional: Negative for activity change, fatigue and fever.  HENT: Negative for congestion.   Respiratory: Negative for cough and shortness of breath.   Cardiovascular: Negative for chest pain.  Gastrointestinal: Negative for abdominal pain.  All other systems reviewed and are negative.    Physical Exam Updated Vital Signs BP (!) 157/80   Pulse (!) 59   Temp 99.4 F (37.4 C) (Oral)   Resp (!) 22   Ht 5\' 7"  (1.702 m)   Wt 97.1 kg (214 lb)   SpO2 93%   BMI 33.52 kg/m   Physical Exam  Constitutional: She is oriented to person, place, and time. She appears well-developed and well-nourished.  HENT:  Head: Normocephalic and atraumatic.  Eyes: Right eye exhibits no discharge.  Neck:  Tenderness to the trapezius musculature.  Can feel tightness of the musculature in that area.  Pain elicited when moving her left arm.  Cardiovascular: Normal rate, regular rhythm and normal pulses.  Pulmonary/Chest: Effort normal and breath sounds normal. No accessory muscle usage. No respiratory distress.    Neurological: She is oriented to person, place, and time.  Skin: Skin is warm and dry. She is not diaphoretic.  Psychiatric: She has a normal mood and affect.  Nursing note and vitals reviewed.    ED Treatments / Results  Labs (all labs ordered are listed, but only abnormal results are displayed) Labs Reviewed  BASIC METABOLIC PANEL - Abnormal; Notable for the following components:      Result Value   Chloride 100 (*)    Glucose, Bld 243 (*)    Creatinine, Ser 1.25 (*)    GFR calc non Af Amer 37 (*)    GFR calc Af Amer 43 (*)    All other components within normal limits  CBC - Abnormal; Notable  for the following components:   RDW 16.6 (*)    All other components within normal limits  I-STAT TROPONIN, ED  I-STAT TROPONIN, ED    EKG  EKG Interpretation  Date/Time:  Tuesday October 10 2017 10:21:45 EST Ventricular Rate:  73 PR Interval:  206 QRS Duration: 158 QT Interval:  448 QTC Calculation: 493 R Axis:   -21 Text Interpretation:  Normal sinus rhythm Left bundle branch block Abnormal ECG No significant change since last tracing Confirmed by Bary Castilla ([id number]) on 10/10/2017 12:17:07 PM       Radiology Dg Chest 2 View  Result Date: 10/10/2017 CLINICAL DATA:  Left upper chest pain radiating to the shoulder and neck EXAM: CHEST  2 VIEW COMPARISON:  Portable chest x-ray of 02/15/2017 FINDINGS: Moderate cardiomegaly is stable. No pneumonia or pleural effusion is seen. Mild bibasilar linear atelectasis or scarring is present. There is some peribronchial thickening which can be seen with bronchitis. Mediastinal and hilar contours are unremarkable. There are degenerative changes in the mid to lower thoracic spine. IMPRESSION: 1. Stable cardiomegaly. 2. Mild bibasilar linear atelectasis or scarring. 3. Some peribronchial thickening can be seen with bronchitis. No pneumonia or effusion is seen. Electronically Signed   By: Dwyane Dee M.D.   On: 10/10/2017 10:57     Procedures Procedures (including critical care time)  Medications Ordered in ED Medications  acetaminophen (TYLENOL) tablet 650 mg (650 mg Oral Given 10/10/17 1256)  cyclobenzaprine (FLEXERIL) tablet 5 mg (5 mg Oral Given 10/10/17 1256)     Initial Impression / Assessment and Plan / ED Course  I have reviewed the triage vital signs and the nursing notes.  Pertinent labs & imaging results that were available during my care of the patient were reviewed by me and considered in my medical decision making (see chart for details).     84 year old female presenting with muscle pain in her left neck.  Patient has past medical history significant for obesity, hypertension hyperlipidemia history of MI and stroke.  Reportedly woke from sleep and has pain in the left neck area.  She reports she had an accident there over 50 years ago and has some plates in that area.  Patient has tenderness to touch of the muscle.  Patient says it hurts when she moves her arm.  1:01 PM Patient has no other symptoms aside from pain and tenderness in that specific localized area.  It is made worse by moving.  Everything about this point to musculoskeletal pain.  However she has an incredible number of risk factors.  Will do delta troponin to make sure there is no cardiac etiology.  EKG appears nonischemic at this time.  4:09 PM Patient feels much better after muscle relaxant.  Delta troponin negative.  Patient has no complaints.  Will discharge.    Final Clinical Impressions(s) / ED Diagnoses   Final diagnoses:  Muscle spasm    ED Discharge Orders        Ordered    cyclobenzaprine (FLEXERIL) 5 MG tablet  2 times daily PRN     10/10/17 1608       Kerwin Augustus, Cindee Salt, MD 10/10/17 1612

## 2017-10-10 NOTE — ED Triage Notes (Signed)
PT reports left sided chest pain that radiates into shoulder that began yesterday. Pt endorses sob and pain worse with movement.

## 2017-10-10 NOTE — Discharge Instructions (Signed)
You were seen with pain in your left trapezius muscle.  It was worse with movement.  I think is likely due to muscle.  However we also tried to make sure that it was not her heart today.  If you are having any increasing pain or other concerns please return immediately to the emergency department.  We can ever be 100% sure that it wasn't something dangerous, so please return with any concerns.

## 2017-10-10 NOTE — ED Notes (Signed)
Hooked patient up to the monitor patient is resting with call bell in reach and family at bedside 

## 2017-10-10 NOTE — ED Notes (Signed)
Patient reports left shoulder pain. Reports it going from neck down to shoulder. Rates it 9 on a 0-10 scale. Patient's daughter is at bedside-she states that patient broke her collar bone many years ago. Patient states that she has a "plate in the neck and it hurts"

## 2017-10-10 NOTE — ED Notes (Signed)
Please call daughter Heron NayMary Calvin [phone] if patient is discharging. She will take her home

## 2017-10-17 ENCOUNTER — Ambulatory Visit (INDEPENDENT_AMBULATORY_CARE_PROVIDER_SITE_OTHER): Payer: Medicare HMO | Admitting: Adult Health

## 2017-10-17 ENCOUNTER — Encounter: Payer: Self-pay | Admitting: Adult Health

## 2017-10-17 DIAGNOSIS — J9611 Chronic respiratory failure with hypoxia: Secondary | ICD-10-CM | POA: Diagnosis not present

## 2017-10-17 DIAGNOSIS — G4733 Obstructive sleep apnea (adult) (pediatric): Secondary | ICD-10-CM

## 2017-10-17 DIAGNOSIS — E669 Obesity, unspecified: Secondary | ICD-10-CM | POA: Diagnosis not present

## 2017-10-17 NOTE — Assessment & Plan Note (Signed)
Mild OSA - discussed tx options with pt and caregiver .  Pt would like to proceed with CPAP   Plan  Patient Instructions  Begin CPAP at bedtime.  Try to wear CPAP at least 4-6 hours each night Work on healthy weight Follow-up in 6-8 weeks with CPAP download with Dr. Maple HudsonYoung  And As needed

## 2017-10-17 NOTE — Patient Instructions (Addendum)
Begin CPAP at bedtime.  Try to wear CPAP at least 4-6 hours each night Work on healthy weight Follow-up in 6-8 weeks with CPAP download with Dr. Maple HudsonYoung  And As needed

## 2017-10-17 NOTE — Assessment & Plan Note (Signed)
Check ONO on CPAP on return

## 2017-10-17 NOTE — Progress Notes (Signed)
 @Patient  ID: Meagan HartMary F Roberts, female    DOB: 04/14/1927, 84 y.o.   MRN: [id number]  Chief Complaint  Patient presents with  . Follow-up    OSA    Referring provider: Olive Bassough, Robert L, MD  HPI: 84 year old female never smoker followed for obstructive sleep apnea Past medical history includes congestive heart failure, coronary artery disease, chronic kidney disease, diabetes, obesity, CVA, visual impairment  10/17/2017 Follow up ; OSA  Patient returns for a 582-month follow-up.  She was seen last visit for a sleep consult.  Patient was set up for a split-night sleep study.  [facility] [place].  Sleep study on August 23, 2017 showed mild sleep apnea with an AHI at 12.4\hour she was successfully treated with CPAP optimal pressure at 11 cm H2O.  We discussed these results with patient and caregiver.  Patient would like to proceed with CPAP.Marland Kitchen.  Allergies  Allergen Reactions  . Atorvastatin Other (See Comments)    Other reaction(s): Myalgias (intolerance)  . Penicillins Itching and Rash    Family members do not know the specifics  . Sulfur Itching and Rash    ITCHING & RASH    Immunization History  Administered Date(s) Administered  . Influenza, High Dose Seasonal PF 05/08/2017    Past Medical History:  Diagnosis Date  . Arthritis   . Asthma   . CHF (congestive heart failure) (HCC)   . CKD (chronic kidney disease), stage III (HCC)   . Depression   . Diabetes mellitus without complication (HCC)   . Hypertension   . MI (myocardial infarction) (HCC)   . Pneumonia 02/2017  . Stroke [facility](HCC)     Tobacco History: Social History   Tobacco Use  Smoking Status Never Smoker  Smokeless Tobacco Never Used   Counseling given: Not Answered   Outpatient Encounter Medications as of 10/17/2017  Medication Sig  . amLODipine (NORVASC) 10 MG tablet Take 10 mg by mouth daily.  Marland Kitchen. aspirin 81 MG EC tablet Take 1 tablet (81 mg total) by mouth daily.  .  carvedilol (COREG) 12.5 MG tablet Take 12.5 mg by mouth 2 (two) times daily with a meal.   . colchicine 0.6 MG tablet Take 0.6 mg by mouth daily.   . cyclobenzaprine (FLEXERIL) 5 MG tablet Take 1 tablet (5 mg total) by mouth 2 (two) times daily as needed for muscle spasms.  Marland Kitchen. ezetimibe (ZETIA) 10 MG tablet Take 10 mg by mouth daily.  . furosemide (LASIX) 20 MG tablet take 1 tablet by mouth daily for FLUID  . gabapentin (NEURONTIN) 300 MG capsule Take 300 mg by mouth at bedtime.  . hydrALAZINE (APRESOLINE) 25 MG tablet Take 12.5 mg by mouth 2 (two) times daily.   . insulin aspart (NOVOLOG) 100 UNIT/ML injection Inject 0-20 Units into the skin 3 (three) times daily with meals. (Patient taking differently: Inject 15-20 Units into the skin See admin instructions. Use 15 units at breakfast and lunch then use 20 units at supper)  . insulin glargine (LANTUS) 100 UNIT/ML injection Inject 0.25 mLs (25 Units total) into the skin daily. (Patient taking differently: Inject 50 Units into the skin daily. )  . isosorbide mononitrate (IMDUR) 60 MG 24 hr tablet Take 60 mg by mouth daily.  . nitroGLYCERIN (NITROSTAT) 0.4 MG SL tablet Place 1 tablet (0.4 mg total) under the tongue every 5 (five) minutes x 3 doses as needed for chest pain.  . pantoprazole (PROTONIX) 40 MG tablet  Take 40 mg by mouth 2 (two) times daily.  . traZODone (DESYREL) 50 MG tablet Take 50 mg by mouth at bedtime.  . clopidogrel (PLAVIX) 75 MG tablet Take 75 mg by mouth daily.    No facility-administered encounter medications on file as of 10/17/2017.      Review of Systems  Constitutional:   No  weight loss, night sweats,  Fevers, chills, fatigue, or  lassitude.  HEENT:   No headaches,  Difficulty swallowing,  Tooth/dental problems, or  Sore throat,                No sneezing, itching, ear ache, nasal congestion, post nasal drip,  +visual impairment   CV:  No chest pain,  Orthopnea, PND, swelling in lower extremities, anasarca,  dizziness, palpitations, syncope.   GI  No heartburn, indigestion, abdominal pain, nausea, vomiting, diarrhea, change in bowel habits, loss of appetite, bloody stools.   Resp: No shortness of breath with exertion or at rest.  No excess mucus, no productive cough,  No non-productive cough,  No coughing up of blood.  No change in color of mucus.  No wheezing.  No chest wall deformity  Skin: no rash or lesions.  GU: no dysuria, change in color of urine, no urgency or frequency.  No flank pain, no hematuria   MS:  No joint pain or swelling.  No decreased range of motion.  No back pain.    Physical Exam  BP 138/68 (BP Location: Left Arm, Cuff Size: Normal)   Pulse 76   Ht 5\' 6"  (1.676 m)   Wt 216 lb (98 kg)   SpO2 93%   BMI 34.86 kg/m   GEN: A/Ox3; pleasant , NAD,  Obese , in wc    HEENT:  [place]/AT,  EACs-clear, TMs-wnl, NOSE-clear, THROAT-clear, no lesions, no postnasal drip or exudate noted. Class 3 MP airway   NECK:  Supple w/ fair ROM; no JVD; normal carotid impulses w/o bruits; no thyromegaly or nodules palpated; no lymphadenopathy.    RESP  Clear  P & A; w/o, wheezes/ rales/ or rhonchi. no accessory muscle use, no dullness to percussion  CARD:  RRR, no m/r/g, tr peripheral edema, pulses intact, no cyanosis or clubbing.  GI:   Soft & nt; nml bowel sounds; no organomegaly or masses detected.   Musco: Warm bil, no deformities or joint swelling noted.   Neuro: alert, no focal deficits noted.    Skin: Warm, no lesions or rashes    Lab Results:  CBC    Component Value Date/Time   WBC 7.9 10/10/2017 1021   RBC 4.61 10/10/2017 1021   HGB 12.0 10/10/2017 1021   HCT 37.6 10/10/2017 1021   PLT 216 10/10/2017 1021   MCV 81.6 10/10/2017 1021   MCH 26.0 10/10/2017 1021   MCHC 31.9 10/10/2017 1021   RDW 16.6 (H) 10/10/2017 1021   LYMPHSABS 2.6 02/15/2017 0238   MONOABS 0.6 02/15/2017 0238   EOSABS 0.5 02/15/2017 0238   BASOSABS 0.0 02/15/2017 0238    BMET    Component  Value Date/Time   NA 139 10/10/2017 1021   NA 142 03/07/2017 1524   K 3.6 10/10/2017 1021   CL 100 (L) 10/10/2017 1021   CO2 31 10/10/2017 1021   GLUCOSE 243 (H) 10/10/2017 1021   BUN 10 10/10/2017 1021   BUN 27 03/07/2017 1524   CREATININE 1.25 (H) 10/10/2017 1021   CALCIUM 8.9 10/10/2017 1021   GFRNONAA 37 (L) 10/10/2017 1021  GFRAA 43 (L) 10/10/2017 1021    BNP    Component Value Date/Time   BNP 219.7 (H) 02/13/2017 0910    ProBNP No results found for: PROBNP  Imaging: Dg Chest 2 View  Result Date: 10/10/2017 CLINICAL DATA:  Left upper chest pain radiating to the shoulder and neck EXAM: CHEST  2 VIEW COMPARISON:  Portable chest x-ray of 02/15/2017 FINDINGS: Moderate cardiomegaly is stable. No pneumonia or pleural effusion is seen. Mild bibasilar linear atelectasis or scarring is present. There is some peribronchial thickening which can be seen with bronchitis. Mediastinal and hilar contours are unremarkable. There are degenerative changes in the mid to lower thoracic spine. IMPRESSION: 1. Stable cardiomegaly. 2. Mild bibasilar linear atelectasis or scarring. 3. Some peribronchial thickening can be seen with bronchitis. No pneumonia or effusion is seen. Electronically Signed   By: Dwyane Dee M.D.   On: 10/10/2017 10:57     Assessment & Plan:   Chronic respiratory failure (HCC) Check ONO on CPAP on return   OSA (obstructive sleep apnea) Mild OSA - discussed tx options with pt and caregiver .  Pt would like to proceed with CPAP   Plan  Patient Instructions  Begin CPAP at bedtime.  Try to wear CPAP at least 4-6 hours each night Work on healthy weight Follow-up in 6-8 weeks with CPAP download with Dr. Maple Hudson  And As needed       Obesity (BMI 30-39.9) Wt loss      Rubye Oaks, NP 10/17/2017

## 2017-10-17 NOTE — Assessment & Plan Note (Signed)
Wt loss  

## 2017-10-23 ENCOUNTER — Other Ambulatory Visit: Payer: Self-pay | Admitting: *Deleted

## 2017-10-23 MED ORDER — CARVEDILOL 12.5 MG PO TABS: 12.5000 mg | ORAL_TABLET | Freq: Two times a day (BID) | ORAL | 6 refills | Status: AC

## 2017-11-13 ENCOUNTER — Ambulatory Visit: Payer: Medicare HMO | Admitting: Podiatry

## 2017-11-14 ENCOUNTER — Ambulatory Visit: Payer: Medicare HMO | Admitting: Cardiology

## 2017-11-20 DIAGNOSIS — E119 Type 2 diabetes mellitus without complications: Secondary | ICD-10-CM | POA: Diagnosis not present

## 2017-11-20 DIAGNOSIS — I2510 Atherosclerotic heart disease of native coronary artery without angina pectoris: Secondary | ICD-10-CM | POA: Diagnosis not present

## 2017-11-20 DIAGNOSIS — F0390 Unspecified dementia without behavioral disturbance: Secondary | ICD-10-CM | POA: Diagnosis not present

## 2017-11-20 DIAGNOSIS — R748 Abnormal levels of other serum enzymes: Secondary | ICD-10-CM | POA: Diagnosis not present

## 2017-11-20 DIAGNOSIS — N189 Chronic kidney disease, unspecified: Secondary | ICD-10-CM | POA: Diagnosis not present

## 2017-11-20 DIAGNOSIS — R51 Headache: Secondary | ICD-10-CM | POA: Diagnosis not present

## 2017-11-21 DIAGNOSIS — R748 Abnormal levels of other serum enzymes: Secondary | ICD-10-CM | POA: Diagnosis not present

## 2017-11-21 DIAGNOSIS — F0390 Unspecified dementia without behavioral disturbance: Secondary | ICD-10-CM | POA: Diagnosis not present

## 2017-11-21 DIAGNOSIS — E119 Type 2 diabetes mellitus without complications: Secondary | ICD-10-CM | POA: Diagnosis not present

## 2017-11-21 DIAGNOSIS — R51 Headache: Secondary | ICD-10-CM | POA: Diagnosis not present

## 2017-11-21 DIAGNOSIS — N189 Chronic kidney disease, unspecified: Secondary | ICD-10-CM | POA: Diagnosis not present

## 2017-11-21 DIAGNOSIS — I2510 Atherosclerotic heart disease of native coronary artery without angina pectoris: Secondary | ICD-10-CM | POA: Diagnosis not present

## 2017-11-26 ENCOUNTER — Encounter: Payer: Self-pay | Admitting: Internal Medicine

## 2017-11-29 ENCOUNTER — Encounter: Payer: Self-pay | Admitting: Internal Medicine

## 2017-11-29 ENCOUNTER — Ambulatory Visit (INDEPENDENT_AMBULATORY_CARE_PROVIDER_SITE_OTHER): Payer: Medicare HMO | Admitting: Internal Medicine

## 2017-11-29 DIAGNOSIS — G4733 Obstructive sleep apnea (adult) (pediatric): Secondary | ICD-10-CM | POA: Diagnosis not present

## 2017-11-29 DIAGNOSIS — J9611 Chronic respiratory failure with hypoxia: Secondary | ICD-10-CM

## 2017-11-29 NOTE — Assessment & Plan Note (Signed)
Treatment for mild OSA is usually directed at symptoms.  At age 84, she is needing more assistance getting out of bed for bedside commode at night and not able to cope with CPAP.  I think the benefit does not outweigh the problems for her associated with CPAP. Plan-DC CPAP and continue oxygen.

## 2017-11-29 NOTE — Patient Instructions (Signed)
Order- [facility]--- please dc CPAP  We can continue O2 2 l   24/ 7  To save you the drive up here, I suggest you follow with Dr Sol Passerough for your breathing problems. He can work with [facility] to continue your oxygen orders as needed.  We will be happy to see you again if Dr Sol Passerough would like our help.

## 2017-11-29 NOTE — Assessment & Plan Note (Addendum)
She sleeps and feels better using oxygen.  2 L remains appropriate. Plan-continue oxygen.  There is no acute process at this time.  The trip here from her home was burdensome and requires assistance.  I suggested that she follow with her primary physician who can manage her oxygen renewal orders.  We can see her again if needed.

## 2017-11-29 NOTE — Progress Notes (Signed)
07/11/17-84 year old female never smoker for sleep evaluation. Medical problem list includes CHF, CAD/ MI, LBBB, CKD, DM 2, HBP, obesity, CVA, nearly blind Sleep Consult per Hospital D/C paperwork Hospitalized in July with NSTEMI,, CHF, acute on chronic respiratory failure/hypoxia, pulmonary edema, HCAP,  O2 2 L sleep and as needed/[facility]    She lives in [place] with 1 daughter but a different daughter came with her today. She was told in hospital that she was snoring and had witnessed sleep apnea.  Frequently at night but denies daytime sleepiness. Unclear about limb movement.  No ENT surgery.  11/29/2017-84 year old female never smoker followed for OSA, chronic respiratory failure, complicated by CHF, CAD/ MI, LBBB, CKD, DM 2, HBP, obesity, CVA, nearly blind O2 2 L sleep and as needed/[facility]  split-night sleep study.  This was done at [facility] in [place].  Sleep study on August 23, 2017 showed mild sleep apnea with an AHI at 12.4\hour she was successfully treated with CPAP optimal pressure at 11 cm H2O Failed to tolerate CPAP. Reports fall last night, sore right shoulder and arm-daughter wanted it documented but apparently the aide who helped dresss through this morning did not note any bruising. Patient says she is ok and not hurting. She has problems getting up at night for bedside commode. This compounds difficulties with CPAP.  While here mentioned substernal nonradiating pain that was relieved when daughter had her sit up straighter.  ROS-see HPI   + = positive Constitutional:    weight loss, night sweats, fevers, chills, fatigue, lassitude. HEENT:    headaches, difficulty swallowing, tooth/dental problems, sore throat,       sneezing, itching, ear ache, nasal congestion, post nasal drip, snoring CV:    +chest pain, orthopnea, PND, swelling in lower extremities, anasarca,                                                dizziness, palpitations Resp:  + shortness of breath  with exertion or at rest.                productive cough,   non-productive cough, coughing up of blood.              change in color of mucus.  wheezing.   Skin:    rash or lesions. GI:  No-   heartburn, indigestion, abdominal pain, nausea, vomiting, diarrhea,                 change in bowel habits, loss of appetite GU: dysuria, change in color of urine, no urgency or frequency.   flank pain. MS:   joint pain, stiffness, decreased range of motion, back pain. Neuro-     nothing unusual Psych:  change in mood or affect.  depression or anxiety.   memory loss.  OBJ- Physical Exam  +O2  2L POC General- Alert, Oriented, Affect-appropriate, Distress- none acute, + obese Skin- rash-none, lesions- none, excoriation- none Lymphadenopathy- none Head- atraumatic            Eyes- +Gross vision severely limited, dense right cataract            Ears- Hearing, canals-normal            Nose- Clear, no-Septal dev, mucus, polyps, erosion, perforation             Throat- Mallampati IV ,  mucosa clear , drainage- none, tonsils- atrophic + dentures                     neck- flexible , trachea midline, no stridor , thyroid nl, carotid no bruit Chest - symmetrical excursion , unlabored           Heart/CV- RRR , no murmur , no gallop  , no rub, nl s1 s2                           - JVD- none , edema- none, stasis changes- none, varices- none           Lung- clear to P&A, wheeze- none, cough- none , dullness-none, rub- none           Chest wall-  Abd-  Br/ Gen/ Rectal- Not done, not indicated Extrem- cyanosis- none, clubbing, none, atrophy- none, strength- nl, + wheelchair Neuro- grossly intact to observation

## 2018-01-23 ENCOUNTER — Ambulatory Visit: Payer: Medicare HMO | Admitting: Cardiology

## 2018-01-26 ENCOUNTER — Telehealth: Payer: Self-pay | Admitting: Cardiology

## 2018-01-26 NOTE — Telephone Encounter (Signed)
Received records from [facility] on 01/26/18, Appt 02/13/18 @ 10:00AM. NV

## 2018-01-29 ENCOUNTER — Telehealth: Payer: Self-pay | Admitting: Cardiovascular Disease

## 2018-01-29 NOTE — Telephone Encounter (Signed)
New Message   Pt's daughter states that some records was sent over regarding the pt's gallbladder and wants to be in the loop with any discission made and also wants a second opinion on the results. Please call

## 2018-01-29 NOTE — Telephone Encounter (Signed)
Returned call to daughter (ok per DPR)-she states we should have received records from [facility] (Dr. Charm BargesButler) in regards to surgical clearance for patients gallbladder.  She states they are trying to get patient scheduled for surgery due to gallstones that are causing patient a lot of pain.    Advised records were received and daughter will be coming to appt with patient to discuss.    Moved patients appt to Thursday 6/27 at 1130 with L. Kilroy PA to discuss.    Daughter aware and verbalized understanding.

## 2018-02-01 ENCOUNTER — Encounter: Payer: Self-pay | Admitting: Cardiology

## 2018-02-01 ENCOUNTER — Ambulatory Visit (INDEPENDENT_AMBULATORY_CARE_PROVIDER_SITE_OTHER): Payer: Medicare HMO | Admitting: Cardiology

## 2018-02-01 VITALS — BP 165/79 | HR 80 | Ht 65.0 in | Wt 194.6 lb

## 2018-02-01 DIAGNOSIS — I5042 Chronic combined systolic (congestive) and diastolic (congestive) heart failure: Secondary | ICD-10-CM | POA: Diagnosis not present

## 2018-02-01 DIAGNOSIS — I25110 Atherosclerotic heart disease of native coronary artery with unstable angina pectoris: Secondary | ICD-10-CM | POA: Diagnosis not present

## 2018-02-01 DIAGNOSIS — Z01810 Encounter for preprocedural cardiovascular examination: Secondary | ICD-10-CM | POA: Insufficient documentation

## 2018-02-01 NOTE — Progress Notes (Signed)
02/01/2018 Meagan Roberts   October 25, 1926  [id number]  Primary Physician Sol Passer, Doris Cheadle, MD Primary Cardiologist: Dr Royann Shivers  HPI:  Pleasant, fairly debilitated 84 y/o AA female, here in the office with her daughter to discuss pre op (elective cholecystectomy) clearance.  We saw the patient after she was admitted to [facility] 02/02/17 with CAP and respiratory failure. She developed chest pain a few days after admission with a new LBBB and Troponin elevation. She was transferred to [facility] 02/10/17. Her respiratory failure was treated. It was felt to be a combination of CAP and combined CHF. She did diurese 10L. On 02/15/17 she had a cath which revealed 3V CAD not amenable to PCI or CABG.  Echo done 02/13/17 showed her EF to be 40-45%.  She was discharged to a SNF but now is back in her home with her daughter and nephew.   The pt has developed colic and has gall stones. She was evaluated by Dr Charm Barges in [place]. The pt was scheduled to see [place] anyway and now we are asked to comment on her pre op cardiovascular status. She is a class 4 risk with a 15% of adverse cardiac event with surgery. From a cardiac standpoint the pt appears stable, no obvious angina, no CHF on exam. Her LVF was 40-45% by echo July 2018. I discussed her case with Dr Royann Shivers today. He feels she is a moderate to high risk for cardiac complications with surgery. There is really not much else we can offer pre op. It would OK to hold her Plavix for surgery.  Continue beta blocker peri op. As Dr Charm Barges points out, this would be even higher risk surgery if the pt had to have surgery on an emergency basis for acute cholecystitis. We told the family it is a quality of life decision. Repeated trips to the ED for abdominal pain are obviously not acceptable. The pt is to have an abdominal US tomorrow and has yet to see a Careers adviser.    Current Outpatient Medications  Medication Sig Dispense Refill  . amitriptyline (ELAVIL) 25 MG tablet Take 25 mg by mouth at  bedtime.    Marland Kitchen aspirin 81 MG EC tablet Take 1 tablet (81 mg total) by mouth daily. 30 tablet 11  . carvedilol (COREG) 12.5 MG tablet Take 1 tablet (12.5 mg total) by mouth 2 (two) times daily with a meal. 60 tablet 6  . clopidogrel (PLAVIX) 75 MG tablet Take 75 mg by mouth daily.   0  . colchicine 0.6 MG tablet Take 0.6 mg by mouth daily.   0  . ezetimibe (ZETIA) 10 MG tablet Take 10 mg by mouth daily.    . furosemide (LASIX) 20 MG tablet take 1 tablet by mouth daily for FLUID 90 tablet 1  . gabapentin (NEURONTIN) 300 MG capsule Take 300 mg by mouth at bedtime.  0  . insulin glargine (LANTUS) 100 UNIT/ML injection Inject 0.25 mLs (25 Units total) into the skin daily. (Patient taking differently: Inject 50 Units into the skin daily. ) 10 mL 11  . isosorbide mononitrate (IMDUR) 60 MG 24 hr tablet Take 60 mg by mouth daily.  0  . mirtazapine (REMERON) 30 MG tablet Take 1 tablet by mouth daily.  5  . nitroGLYCERIN (NITROSTAT) 0.4 MG SL tablet Place 1 tablet (0.4 mg total) under the tongue every 5 (five) minutes x 3 doses as needed for chest pain. 25 tablet 4  . pantoprazole (PROTONIX) 40 MG tablet Take 40  mg by mouth 2 (two) times daily.  0  . traZODone (DESYREL) 50 MG tablet Take 50 mg by mouth at bedtime.  0   No current facility-administered medications for this visit.     Allergies  Allergen Reactions  . Atorvastatin Other (See Comments)    Other reaction(s): Myalgias (intolerance)  . Penicillins Itching and Rash    Family members do not know the specifics  . Sulfur Itching and Rash    ITCHING & RASH    Past Medical History:  Diagnosis Date  . Arthritis   . Asthma   . CHF (congestive heart failure) (HCC)   . CKD (chronic kidney disease), stage III (HCC)   . Depression   . Diabetes mellitus without complication (HCC)   . Hypertension   . MI (myocardial infarction) (HCC)   . Pneumonia 02/2017  . Stroke [facility])     Social History   Socioeconomic History  . Marital status:  Divorced    Spouse name: Not on file  . Number of children: Not on file  . Years of education: Not on file  . Highest education level: Not on file  Occupational History  . Not on file  Social Needs  . Financial resource strain: Not on file  . Food insecurity:    Worry: Not on file    Inability: Not on file  . Transportation needs:    Medical: Not on file    Non-medical: Not on file  Tobacco Use  . Smoking status: Never Smoker  . Smokeless tobacco: Never Used  Substance and Sexual Activity  . Alcohol use: No  . Drug use: No  . Sexual activity: Not on file  Lifestyle  . Physical activity:    Days per week: Not on file    Minutes per session: Not on file  . Stress: Not on file  Relationships  . Social connections:    Talks on phone: Not on file    Gets together: Not on file    Attends religious service: Not on file    Active member of club or organization: Not on file    Attends meetings of clubs or organizations: Not on file    Relationship status: Not on file  . Intimate partner violence:    Fear of current or ex partner: Not on file    Emotionally abused: Not on file    Physically abused: Not on file    Forced sexual activity: Not on file  Other Topics Concern  . Not on file  Social History Narrative  . Not on file     Family History  Problem Relation Age of Onset  . Hypertension Mother   . Hypertension Father      Review of Systems: General: negative for chills, fever, night sweats or weight changes.  Cardiovascular: negative for chest pain, dyspnea on exertion, edema, orthopnea, palpitations, paroxysmal nocturnal dyspnea or shortness of breath Dermatological: negative for rash Respiratory: negative for cough or wheezing Urologic: negative for hematuria Abdominal: negative for nausea, vomiting, diarrhea, bright red blood per rectum, melena, or hematemesis She has done pretty well as far as abdominal pain goes- no severe symptoms x 2 weeks.  Neurologic:  negative for visual changes, syncope, or dizziness All other systems reviewed and are otherwise negative except as noted above.    Blood pressure (!) 165/79, pulse 80, height 5\' 5"  (1.651 m), weight 194 lb 9.6 oz (88.3 kg), SpO2 92 %.  General appearance: alert, cooperative, appears stated age,  no distress and in a wheel chair, chronically ill appearing Neck: no carotid bruit and no JVD Lungs: clear to auscultation bilaterally Heart: regular rate and rhythm Abdomen: soft, non-tender; bowel sounds normal; no masses,  no organomegaly Extremities: trace edema Pulses: 2+ and symmetric Skin: Skin color, texture, turgor normal. No rashes or lesions Neurologic: Grossly normal   ASSESSMENT AND PLAN:   Pre op cardiac evaluation- Pt is moderate to high, but not prohibitive risk for cardiac complications with surgery. Current cardiac status is stable.   Cholelithiasis- Considering elective cholecystectomy  Chronic combined systolic and diastolic heart failure (HCC) Diuresed 10L. 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 F/U Dr Royann Shiversroitoru in 6 months.  If surgery decided it would be OK to hold Plavix as needed pre op, continue beta blocker peri op.  Corine ShelterLuke Monice Lundy PA-C 02/01/2018 11:58 AM

## 2018-02-01 NOTE — Patient Instructions (Signed)
Your physician recommends that you schedule a follow-up appointment in: 6 MONTHS WITH DR Royann ShiversROITORU  If you need a refill on your cardiac medications before your next appointment, please call your pharmacy.

## 2018-02-13 ENCOUNTER — Ambulatory Visit: Payer: Medicare HMO | Admitting: Cardiology

## 2018-02-14 ENCOUNTER — Other Ambulatory Visit: Payer: Self-pay | Admitting: *Deleted

## 2018-02-14 MED ORDER — FUROSEMIDE 20 MG PO TABS: 20.0000 mg | ORAL_TABLET | Freq: Every day | ORAL | 1 refills | Status: AC

## 2018-02-26 ENCOUNTER — Encounter: Payer: Self-pay | Admitting: Podiatry

## 2018-02-26 ENCOUNTER — Ambulatory Visit (INDEPENDENT_AMBULATORY_CARE_PROVIDER_SITE_OTHER): Payer: Medicare HMO | Admitting: Podiatry

## 2018-02-26 VITALS — BP 194/121 | HR 73 | Temp 98.6°F | Resp 16

## 2018-02-26 DIAGNOSIS — E1151 Type 2 diabetes mellitus with diabetic peripheral angiopathy without gangrene: Secondary | ICD-10-CM | POA: Diagnosis not present

## 2018-02-26 DIAGNOSIS — B351 Tinea unguium: Secondary | ICD-10-CM

## 2018-02-26 NOTE — Progress Notes (Signed)
  Subjective:  Patient ID: Meagan Roberts, female    DOB: 06/04/1927,  MRN: [id number]  Chief Complaint  Patient presents with  . debride    B/L nail trimming    84 y.o. female returns for diabetic foot care. Missed her last appt. No new issues. Last Saw PCP Dr. Sol Passerough 3 weeks ago. Blood pressure high today.  Objective:   General AA&O x3. Normal mood and affect.  Vascular Dorsalis pedis pulses present 1+ bilaterally  Posterior tibial pulses absent bilaterally  Capillary refill normal to all digits. Pedal hair growth normal.  Neurologic Epicritic sensation present bilaterally. Protective sensation with 5.07 monofilament  present bilaterally. Vibratory sensation present bilaterally.  Dermatologic No open lesions. Interspaces clear of maceration.  Normal skin temperature and turgor. Hyperkeratotic lesions: None bilaterally. Nails: brittle, onychomycosis, thickening, elongation  Orthopedic: No history of amputation. MMT 5/5 in dorsiflexion, plantarflexion, inversion, and eversion. Normal lower extremity joint ROM without pain or crepitus.   Assessment & Plan:  Patient was evaluated and treated and all questions answered.  Diabetes with PAD, Onychomycosis -Educated on diabetic footcare. Diabetic risk level 1 -At risk foot care provided as below.  Procedure: Nail Debridement Rationale: Patient meets criteria for routine foot care due to Class B findings. Type of Debridement: manual, sharp debridement. Instrumentation: Nail nipper, rotary burr. Number of Nails: 10     No follow-ups on file.

## 2018-04-04 ENCOUNTER — Other Ambulatory Visit: Payer: Self-pay | Admitting: Cardiology

## 2018-05-28 ENCOUNTER — Ambulatory Visit: Payer: Medicare HMO | Admitting: Podiatry

## 2018-06-19 DIAGNOSIS — I2510 Atherosclerotic heart disease of native coronary artery without angina pectoris: Secondary | ICD-10-CM

## 2018-06-19 DIAGNOSIS — R4781 Slurred speech: Secondary | ICD-10-CM

## 2018-06-19 DIAGNOSIS — E119 Type 2 diabetes mellitus without complications: Secondary | ICD-10-CM

## 2018-06-19 DIAGNOSIS — F0390 Unspecified dementia without behavioral disturbance: Secondary | ICD-10-CM

## 2018-06-19 DIAGNOSIS — I10 Essential (primary) hypertension: Secondary | ICD-10-CM

## 2018-06-19 DIAGNOSIS — R451 Restlessness and agitation: Secondary | ICD-10-CM

## 2018-06-20 DIAGNOSIS — R4781 Slurred speech: Secondary | ICD-10-CM | POA: Diagnosis not present

## 2018-06-20 DIAGNOSIS — F0390 Unspecified dementia without behavioral disturbance: Secondary | ICD-10-CM | POA: Diagnosis not present

## 2018-06-20 DIAGNOSIS — R451 Restlessness and agitation: Secondary | ICD-10-CM | POA: Diagnosis not present

## 2018-06-20 DIAGNOSIS — I10 Essential (primary) hypertension: Secondary | ICD-10-CM | POA: Diagnosis not present

## 2018-06-21 DIAGNOSIS — F0390 Unspecified dementia without behavioral disturbance: Secondary | ICD-10-CM | POA: Diagnosis not present

## 2018-06-21 DIAGNOSIS — R451 Restlessness and agitation: Secondary | ICD-10-CM | POA: Diagnosis not present

## 2018-06-21 DIAGNOSIS — R4781 Slurred speech: Secondary | ICD-10-CM | POA: Diagnosis not present

## 2018-06-21 DIAGNOSIS — I10 Essential (primary) hypertension: Secondary | ICD-10-CM | POA: Diagnosis not present

## 2018-06-21 DIAGNOSIS — I517 Cardiomegaly: Secondary | ICD-10-CM | POA: Diagnosis not present

## 2018-06-22 DIAGNOSIS — R4781 Slurred speech: Secondary | ICD-10-CM | POA: Diagnosis not present

## 2018-06-22 DIAGNOSIS — R451 Restlessness and agitation: Secondary | ICD-10-CM | POA: Diagnosis not present

## 2018-06-22 DIAGNOSIS — F0390 Unspecified dementia without behavioral disturbance: Secondary | ICD-10-CM | POA: Diagnosis not present

## 2018-06-22 DIAGNOSIS — I10 Essential (primary) hypertension: Secondary | ICD-10-CM | POA: Diagnosis not present

## 2018-06-23 DIAGNOSIS — R4781 Slurred speech: Secondary | ICD-10-CM | POA: Diagnosis not present

## 2018-06-23 DIAGNOSIS — F0390 Unspecified dementia without behavioral disturbance: Secondary | ICD-10-CM | POA: Diagnosis not present

## 2018-06-23 DIAGNOSIS — I10 Essential (primary) hypertension: Secondary | ICD-10-CM | POA: Diagnosis not present

## 2018-06-23 DIAGNOSIS — R451 Restlessness and agitation: Secondary | ICD-10-CM | POA: Diagnosis not present

## 2018-06-24 DIAGNOSIS — R451 Restlessness and agitation: Secondary | ICD-10-CM | POA: Diagnosis not present

## 2018-06-24 DIAGNOSIS — R4781 Slurred speech: Secondary | ICD-10-CM | POA: Diagnosis not present

## 2018-06-24 DIAGNOSIS — F0390 Unspecified dementia without behavioral disturbance: Secondary | ICD-10-CM | POA: Diagnosis not present

## 2018-06-24 DIAGNOSIS — I10 Essential (primary) hypertension: Secondary | ICD-10-CM | POA: Diagnosis not present

## 2018-06-25 DIAGNOSIS — R4781 Slurred speech: Secondary | ICD-10-CM | POA: Diagnosis not present

## 2018-06-25 DIAGNOSIS — I10 Essential (primary) hypertension: Secondary | ICD-10-CM | POA: Diagnosis not present

## 2018-06-25 DIAGNOSIS — R451 Restlessness and agitation: Secondary | ICD-10-CM | POA: Diagnosis not present

## 2018-06-25 DIAGNOSIS — F0390 Unspecified dementia without behavioral disturbance: Secondary | ICD-10-CM | POA: Diagnosis not present

## 2018-07-19 ENCOUNTER — Ambulatory Visit: Payer: Medicare HMO | Admitting: Cardiovascular Disease

## 2018-08-07 ENCOUNTER — Ambulatory Visit: Payer: Medicare HMO | Admitting: Physician Assistant

## 2018-08-08 DEATH — deceased

## 2018-08-20 ENCOUNTER — Encounter: Payer: Self-pay | Admitting: Physician Assistant

## 2018-10-14 IMAGING — CR DG CHEST 1V PORT
1 series · 1 of 1 positions shown · non-contrast
Comparison: 02/10/2017 at 1342 hours

CLINICAL DATA: Dyspnea.

EXAM:
PORTABLE CHEST 1 VIEW

[AP]
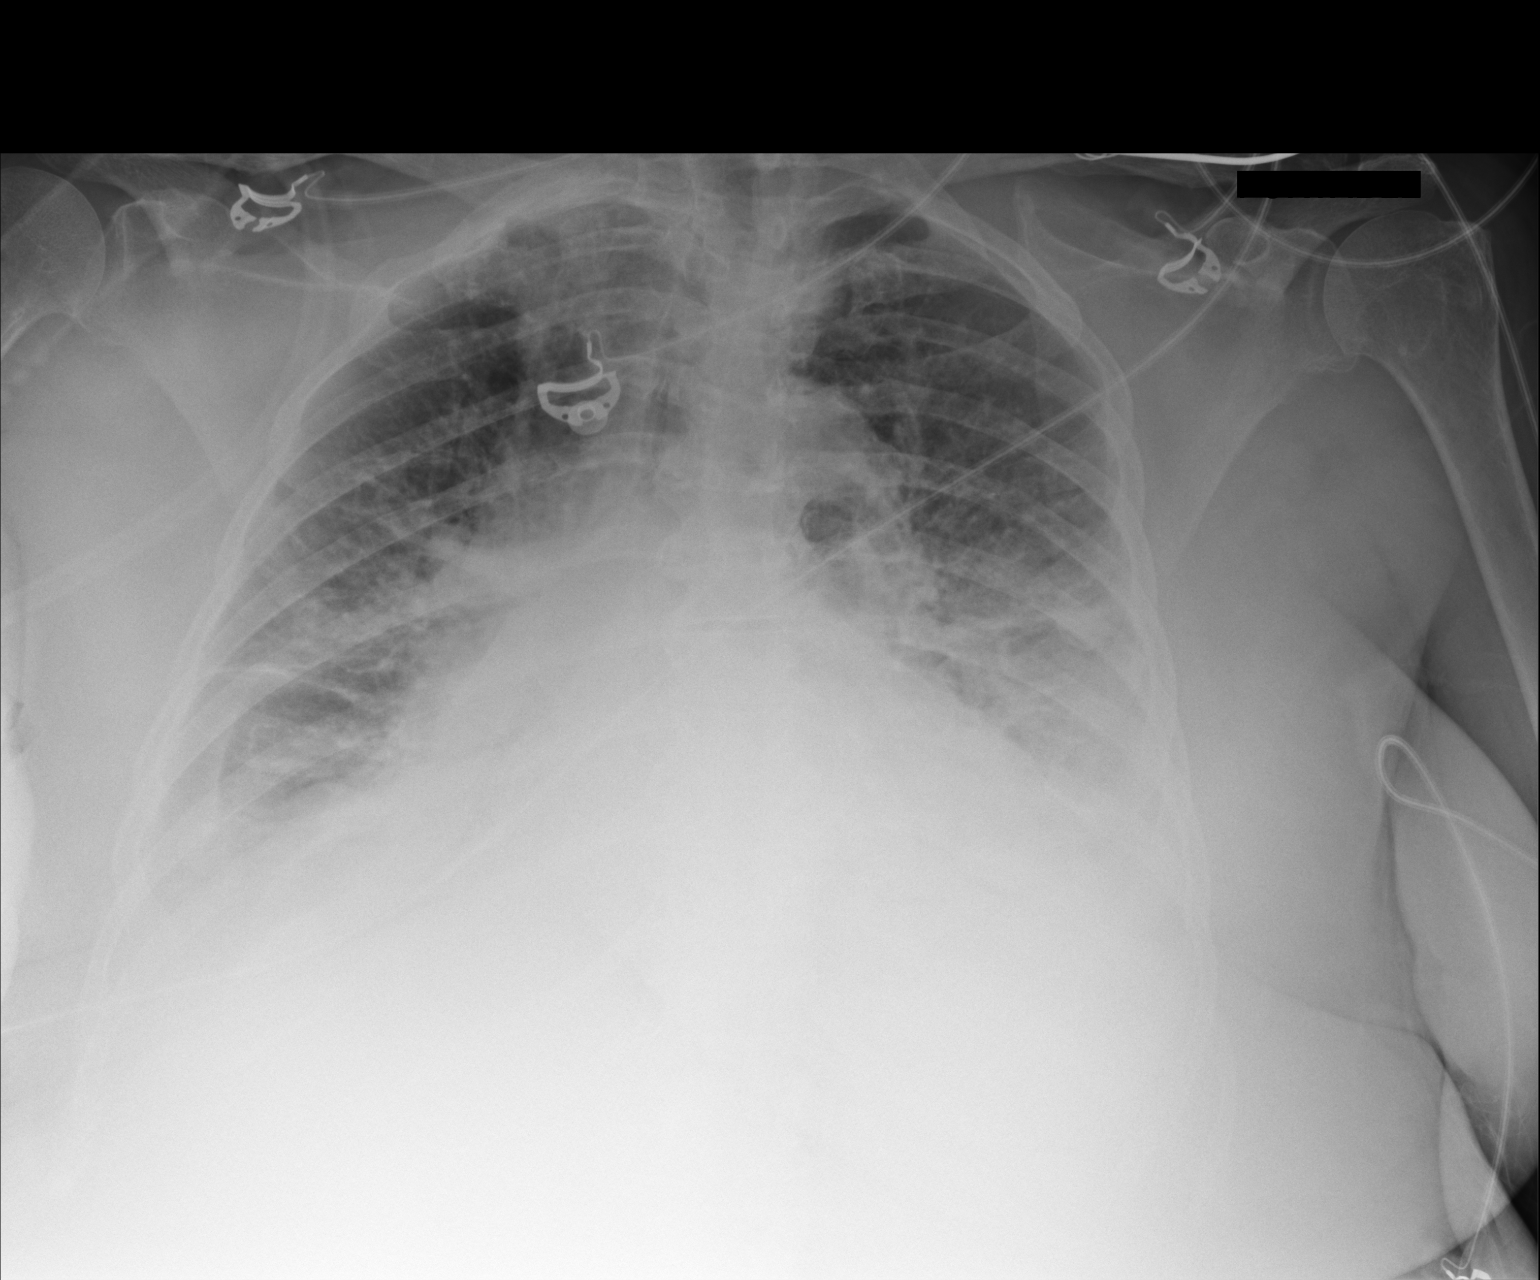

[1 of 1 positions shown; findings below may reference images not displayed]

FINDINGS: Patient rotated minimally right. Midline trachea. Cardiomegaly
accentuated by AP portable technique. Small layering bilateral
pleural effusions. No pneumothorax. Moderate interstitial edema is
not significantly changed. Bibasilar airspace disease is similar,
given differences in technique. Mildly degraded exam due to AP
portable technique and patient body habitus.
IMPRESSION: No significant change since earlier today.

Congestive heart failure with layering bilateral pleural effusions
and bibasilar airspace disease.

## 2018-10-19 IMAGING — CR DG CHEST 1V PORT
1 series · 1 of 1 positions shown · non-contrast
Comparison: February 13, 2017

CLINICAL DATA: Shortness of Breath

EXAM:
PORTABLE CHEST 1 VIEW

[AP]
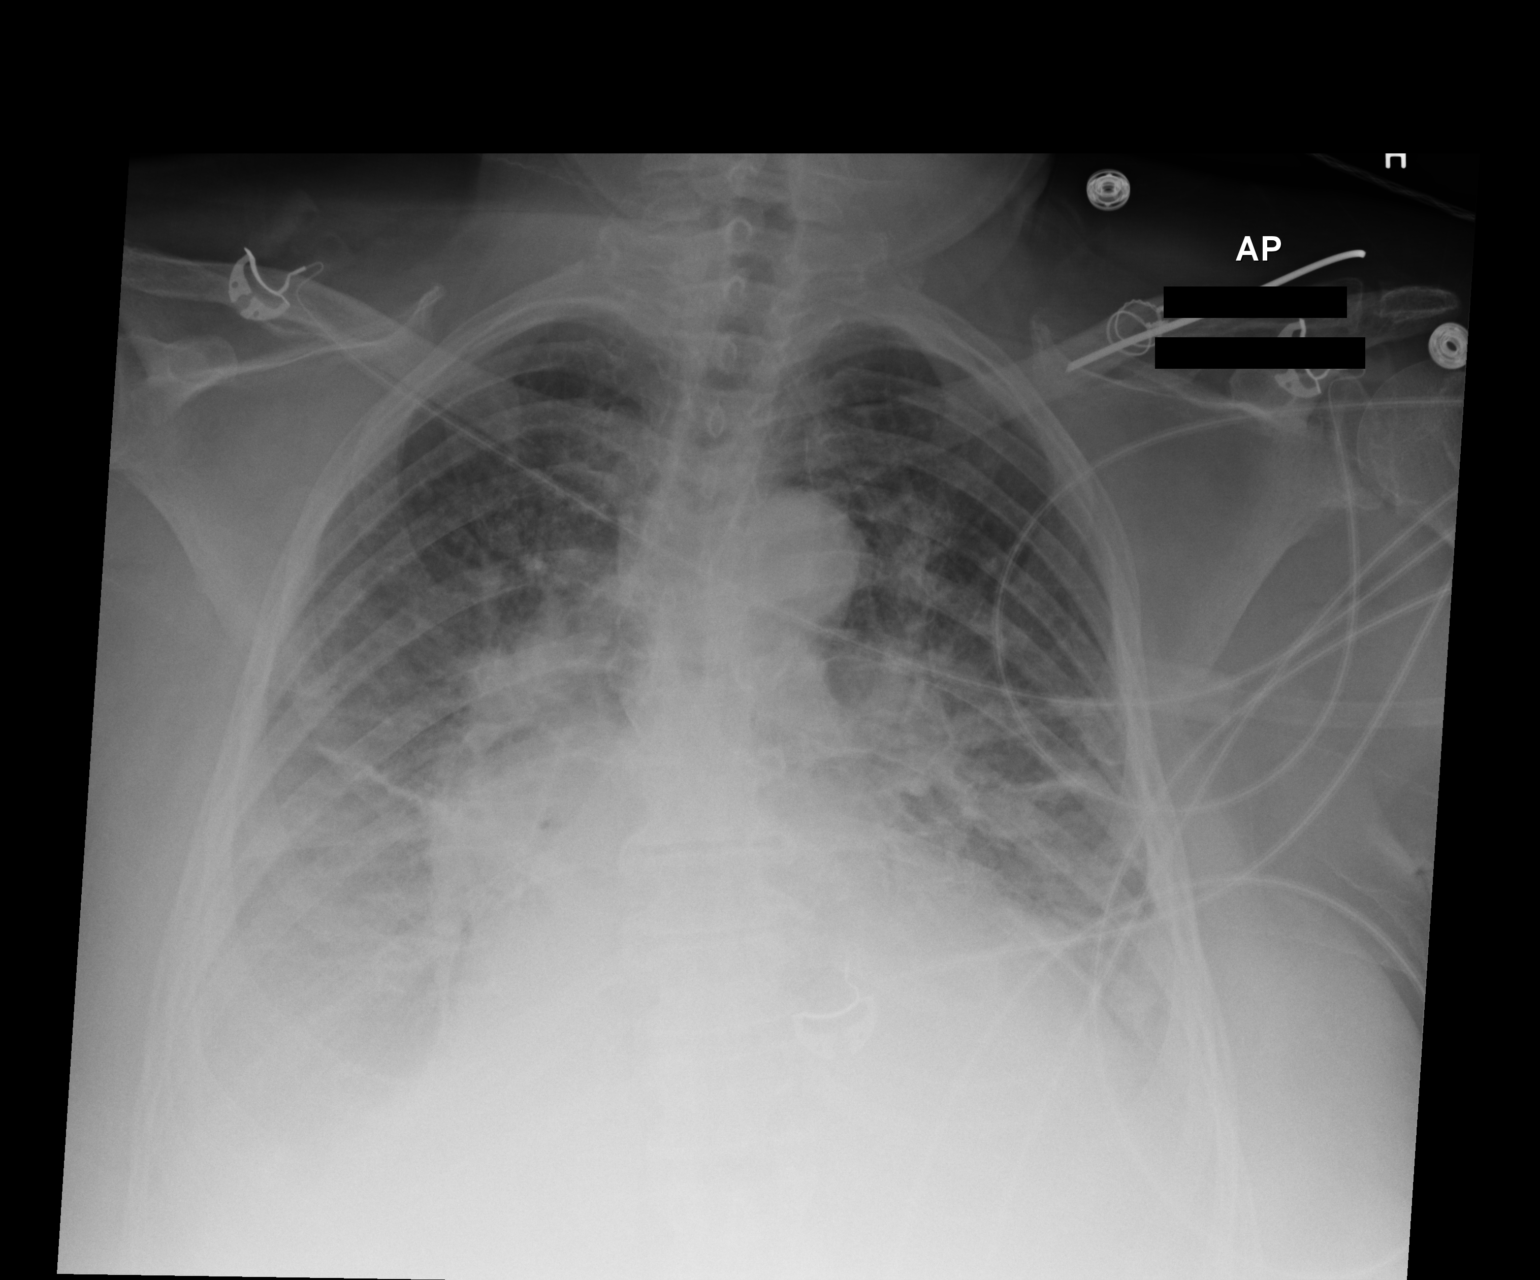

[1 of 1 positions shown; findings below may reference images not displayed]

FINDINGS: There remains generalized interstitial edema. There is probable
patchy alveolar edema in the bases. There are bilateral pleural
effusions. There is atelectatic change in each mid lung. There is
cardiomegaly with pulmonary venous hypertension. No adenopathy is
evident. There is aortic atherosclerosis. There is postoperative
change in the left clavicle.
IMPRESSION: Findings indicative of congestive heart failure, similar to most
recent study. There is new patchy mid lung atelectasis bilaterally.
There is aortic atherosclerosis.

Aortic Atherosclerosis ([id number].S).
